# Patient Record
Sex: Female | Born: 1935
Health system: Southern US, Community
[De-identification: ages and names within clinical notes are randomized; demographics above are authoritative.]

## PROBLEM LIST (undated history)

## (undated) DIAGNOSIS — I639 Cerebral infarction, unspecified: Secondary | ICD-10-CM

## (undated) DIAGNOSIS — M549 Dorsalgia, unspecified: Secondary | ICD-10-CM

## (undated) DIAGNOSIS — M4856XA Collapsed vertebra, not elsewhere classified, lumbar region, initial encounter for fracture: Secondary | ICD-10-CM

## (undated) DIAGNOSIS — I6529 Occlusion and stenosis of unspecified carotid artery: Secondary | ICD-10-CM

## (undated) DIAGNOSIS — G459 Transient cerebral ischemic attack, unspecified: Secondary | ICD-10-CM

## (undated) DIAGNOSIS — I10 Essential (primary) hypertension: Secondary | ICD-10-CM

## (undated) DIAGNOSIS — J449 Chronic obstructive pulmonary disease, unspecified: Secondary | ICD-10-CM

## (undated) HISTORY — PX: APPENDECTOMY: SHX54

## (undated) HISTORY — DX: Cerebral infarction, unspecified: I63.9

---

## 1998-05-18 ENCOUNTER — Emergency Department (HOSPITAL_COMMUNITY): Admission: EM | Admit: 1998-05-18 | Discharge: 1998-05-18 | Payer: Self-pay | Admitting: Emergency Medicine

## 1998-05-20 ENCOUNTER — Ambulatory Visit (HOSPITAL_COMMUNITY): Admission: RE | Admit: 1998-05-20 | Discharge: 1998-05-20 | Payer: Self-pay | Admitting: Rheumatology

## 1998-05-27 ENCOUNTER — Ambulatory Visit (HOSPITAL_COMMUNITY): Admission: RE | Admit: 1998-05-27 | Discharge: 1998-05-27 | Payer: Self-pay | Admitting: Rheumatology

## 1999-12-07 ENCOUNTER — Encounter: Payer: Self-pay | Admitting: Emergency Medicine

## 1999-12-07 ENCOUNTER — Emergency Department (HOSPITAL_COMMUNITY): Admission: EM | Admit: 1999-12-07 | Discharge: 1999-12-07 | Payer: Self-pay | Admitting: Emergency Medicine

## 1999-12-11 ENCOUNTER — Encounter: Payer: Self-pay | Admitting: Rheumatology

## 1999-12-11 ENCOUNTER — Encounter: Admission: RE | Admit: 1999-12-11 | Discharge: 1999-12-11 | Payer: Self-pay | Admitting: Rheumatology

## 1999-12-16 ENCOUNTER — Encounter: Payer: Self-pay | Admitting: Rheumatology

## 1999-12-16 ENCOUNTER — Encounter: Admission: RE | Admit: 1999-12-16 | Discharge: 1999-12-16 | Payer: Self-pay | Admitting: Rheumatology

## 2000-07-24 ENCOUNTER — Encounter: Payer: Self-pay | Admitting: Emergency Medicine

## 2000-07-24 ENCOUNTER — Inpatient Hospital Stay (HOSPITAL_COMMUNITY): Admission: EM | Admit: 2000-07-24 | Discharge: 2000-07-31 | Payer: Self-pay

## 2000-07-25 ENCOUNTER — Encounter: Payer: Self-pay | Admitting: Internal Medicine

## 2000-07-26 ENCOUNTER — Encounter: Payer: Self-pay | Admitting: Internal Medicine

## 2000-07-27 ENCOUNTER — Encounter: Payer: Self-pay | Admitting: Internal Medicine

## 2000-07-28 ENCOUNTER — Encounter: Payer: Self-pay | Admitting: Internal Medicine

## 2000-07-29 ENCOUNTER — Encounter: Payer: Self-pay | Admitting: Internal Medicine

## 2002-03-17 ENCOUNTER — Encounter: Payer: Self-pay | Admitting: Internal Medicine

## 2002-03-17 ENCOUNTER — Encounter: Admission: RE | Admit: 2002-03-17 | Discharge: 2002-03-17 | Payer: Self-pay | Admitting: Internal Medicine

## 2006-08-09 ENCOUNTER — Encounter: Admission: RE | Admit: 2006-08-09 | Discharge: 2006-08-09 | Payer: Self-pay | Admitting: Internal Medicine

## 2006-10-07 ENCOUNTER — Encounter: Admission: RE | Admit: 2006-10-07 | Discharge: 2006-10-07 | Payer: Self-pay | Admitting: *Deleted

## 2007-01-13 ENCOUNTER — Ambulatory Visit: Payer: Self-pay | Admitting: *Deleted

## 2007-02-03 ENCOUNTER — Ambulatory Visit: Payer: Self-pay | Admitting: *Deleted

## 2007-08-18 ENCOUNTER — Ambulatory Visit: Payer: Self-pay | Admitting: *Deleted

## 2008-01-26 ENCOUNTER — Ambulatory Visit: Payer: Self-pay | Admitting: *Deleted

## 2008-08-16 ENCOUNTER — Ambulatory Visit: Payer: Self-pay | Admitting: *Deleted

## 2009-02-07 ENCOUNTER — Ambulatory Visit: Payer: Self-pay | Admitting: *Deleted

## 2009-11-05 ENCOUNTER — Emergency Department (HOSPITAL_COMMUNITY): Admission: EM | Admit: 2009-11-05 | Discharge: 2009-11-05 | Payer: Self-pay | Admitting: Emergency Medicine

## 2010-01-08 ENCOUNTER — Observation Stay (HOSPITAL_COMMUNITY): Admission: EM | Admit: 2010-01-08 | Discharge: 2010-01-09 | Payer: Self-pay | Admitting: Emergency Medicine

## 2011-01-11 LAB — DIFFERENTIAL
Basophils Absolute: 0 10*3/uL (ref 0.0–0.1)
Basophils Relative: 0 % (ref 0–1)
Eosinophils Absolute: 0.1 10*3/uL (ref 0.0–0.7)
Eosinophils Relative: 1 % (ref 0–5)
Monocytes Relative: 4 % (ref 3–12)
Neutrophils Relative %: 88 % — ABNORMAL HIGH (ref 43–77)

## 2011-01-11 LAB — COMPREHENSIVE METABOLIC PANEL
ALT: 15 U/L (ref 0–35)
AST: 22 U/L (ref 0–37)
Albumin: 4 g/dL (ref 3.5–5.2)
Chloride: 97 mEq/L (ref 96–112)
Creatinine, Ser: 1.16 mg/dL (ref 0.4–1.2)

## 2011-01-11 LAB — LIPASE, BLOOD: Lipase: 30 U/L (ref 11–59)

## 2011-01-11 LAB — URINALYSIS, ROUTINE W REFLEX MICROSCOPIC
Bilirubin Urine: NEGATIVE
Hgb urine dipstick: NEGATIVE
Ketones, ur: NEGATIVE mg/dL
Nitrite: NEGATIVE
Protein, ur: 30 mg/dL — AB
Urobilinogen, UA: 0.2 mg/dL (ref 0.0–1.0)

## 2011-01-11 LAB — CBC
MCHC: 33.8 g/dL (ref 30.0–36.0)
Platelets: 225 10*3/uL (ref 150–400)
RBC: 4.57 MIL/uL (ref 3.87–5.11)

## 2011-01-11 LAB — APTT: aPTT: 28 seconds (ref 24–37)

## 2011-01-11 LAB — PROTIME-INR
INR: 0.99 (ref 0.00–1.49)
Prothrombin Time: 13 seconds (ref 11.6–15.2)

## 2011-01-19 LAB — DIFFERENTIAL
Basophils Absolute: 0.1 10*3/uL (ref 0.0–0.1)
Lymphs Abs: 1.5 10*3/uL (ref 0.7–4.0)
Monocytes Absolute: 0.7 10*3/uL (ref 0.1–1.0)
Neutro Abs: 5.6 10*3/uL (ref 1.7–7.7)
Neutrophils Relative %: 69 % (ref 43–77)

## 2011-01-19 LAB — BASIC METABOLIC PANEL
Calcium: 10.2 mg/dL (ref 8.4–10.5)
Chloride: 99 mEq/L (ref 96–112)
Creatinine, Ser: 1.15 mg/dL (ref 0.4–1.2)
GFR calc non Af Amer: 45 mL/min — ABNORMAL LOW (ref >60)
Potassium: 4.5 mEq/L (ref 3.5–5.1)
Sodium: 139 mEq/L (ref 135–145)

## 2011-01-19 LAB — POCT I-STAT, CHEM 8
BUN: 36 mg/dL — ABNORMAL HIGH (ref 6–23)
Chloride: 104 mEq/L (ref 96–112)
Hemoglobin: 15.6 g/dL — ABNORMAL HIGH (ref 12.0–15.0)
Potassium: 3.3 mEq/L — ABNORMAL LOW (ref 3.5–5.1)
TCO2: 32 mmol/L (ref 0–100)

## 2011-01-19 LAB — CBC
HCT: 41 % (ref 36.0–46.0)
Hemoglobin: 13.8 g/dL (ref 12.0–15.0)
MCHC: 32.3 g/dL (ref 30.0–36.0)
MCHC: 33.7 g/dL (ref 30.0–36.0)
MCV: 92 fL (ref 78.0–100.0)
MCV: 94.5 fL (ref 78.0–100.0)

## 2011-01-19 LAB — POCT CARDIAC MARKERS
CKMB, poc: 4.8 ng/mL (ref 1.0–8.0)
Myoglobin, poc: 145 ng/mL (ref 12–200)
Troponin i, poc: <0.05 ng/mL (ref 0.00–0.09)

## 2011-01-19 LAB — POCT I-STAT 3, ART BLOOD GAS (G3+)
O2 Saturation: 100 %
Patient temperature: 98.7
TCO2: 35 mmol/L (ref 0–100)
pCO2 arterial: 53.4 mmHg — ABNORMAL HIGH (ref 35.0–45.0)

## 2011-03-10 NOTE — Procedures (Signed)
CAROTID DUPLEX EXAM   INDICATION:  Followup of known carotid artery disease.   HISTORY:  Diabetes:  No.  Cardiac:  No.  Hypertension:  Yes.  Smoking:  No.  Previous Surgery:  No.  CV History:  No.  Amaurosis Fugax No, Paresthesias No, Hemiparesis No                                       RIGHT             LEFT  Brachial systolic pressure:         176               184  Brachial Doppler waveforms:         Biphasic          Biphasic  Vertebral direction of flow:        Antegrade         Antegrade  DUPLEX VELOCITIES (cm/sec)  CCA peak systolic                   97                43  ECA peak systolic                   227               107  ICA peak systolic                   180               Occluded  ICA end diastolic                   59                Occluded  PLAQUE MORPHOLOGY:                  Heterogeneous     Calcified  PLAQUE AMOUNT:                      Moderate          Severe  PLAQUE LOCATION:                    Bif/ICA           Bif/ICA   IMPRESSION:  1. 40-59% right internal carotid artery stenosis.  2. Occluded left internal carotid artery stenosis.       ___________________________________________  P. Liliane Bade, M.D.   AC/MEDQ  D:  02/07/2009  T:  02/07/2009  Job:  [id number]

## 2011-03-10 NOTE — Procedures (Signed)
CAROTID DUPLEX EXAM   INDICATION:  Followup of known carotid artery disease.  Patient is  asymptomatic.  Known left ICA occlusion.   HISTORY:  Diabetes:  No.  Cardiac:  No.  Hypertension:  Yes.  Smoking:  No.  Previous Surgery:  No.  CV History:  Amaurosis Fugax No, Paresthesias No, Hemiparesis No                                       RIGHT             LEFT  Brachial systolic pressure:         150               138  Brachial Doppler waveforms:         WNL               WNL  Vertebral direction of flow:        Antegrade         Antegrade  (atypical)  DUPLEX VELOCITIES (cm/sec)  CCA peak systolic                   74                23  ECA peak systolic                   96                69  ICA peak systolic                   143 (P-M)         Occluded  ICA end diastolic                   53                Occluded  PLAQUE MORPHOLOGY:                  Mixed             Mixed  PLAQUE AMOUNT:                      Moderate          Severe  PLAQUE LOCATION:                    DCCA, bifurcation, ICA, ECA         CCA, bifurcation, ICA,  ECA   IMPRESSION:  1. Mixed plaque noted throughout left CCA.  2. Tortuous right ICA.  3. Right 40-59% ICA stenosis.  4. Known left ICA occlusion.  5. Bilateral antegrade flow in vertebral arteries, however, left      vertebral waveform demonstrates progressive early systolic      deceleration consistent with subclavian stenosis.  6. Study essentially unchanged from that on 08/18/2007.   ___________________________________________  P. Liliane Bade, M.D.   PB/MEDQ  D:  01/26/2008  T:  01/26/2008  Job:  [id number]

## 2011-03-10 NOTE — Procedures (Signed)
CAROTID DUPLEX EXAM   INDICATION:  Follow up known carotid artery disease.   HISTORY:  Diabetes:  No.  Cardiac:  No.  Hypertension:  Yes.  Smoking:  No.  Previous Surgery:  CV History:  Amaurosis Fugax No, Paresthesias No, Hemiparesis No.                                       RIGHT             LEFT  Brachial systolic pressure:         160               160  Brachial Doppler waveforms:         Biphasic          Biphasic  Vertebral direction of flow:        Antegrade         Antegrade  DUPLEX VELOCITIES (cm/sec)  CCA peak systolic                   69                57  ECA peak systolic                   219               90  ICA peak systolic                   183               Occluded  ICA end diastolic                   40                Occluded  PLAQUE MORPHOLOGY:                  Heterogenous      Heterogenous  PLAQUE AMOUNT:                      Moderate          Severe  PLAQUE LOCATION:                    ICA, ECA          ICA, ECA   IMPRESSION:  1. Occluded left internal carotid artery.  2. 40-59% stenosis noted in the right internal carotid artery.  3. Antegrade bilateral vertebral arteries.   ___________________________________________  P. Liliane Bade, M.D.   MG/MEDQ  D:  08/16/2008  T:  08/17/2008  Job:  [id number]

## 2011-03-10 NOTE — Procedures (Signed)
CAROTID DUPLEX EXAM   INDICATION:  Followup carotid artery disease   HISTORY:  Diabetes:  No  Cardiac:  No  Hypertension:  Yes  Smoking:  No  Previous Surgery:  No  CV History:  In March, had near syncopal episode, asymptomatic now  Amaurosis Fugax No, Paresthesias No, Hemiparesis No   Patient stated she is getting bilateral upper body washed out feeling  intermittently several times a week which last approximately 1 minute.                                       RIGHT             LEFT  Brachial systolic pressure:         170               173  Brachial Doppler waveforms:         Triphasic         Triphasic  Vertebral direction of flow:        Antegrade         Atypical  DUPLEX VELOCITIES (cm/sec)  CCA peak systolic                   55                33  ECA peak systolic                   124               74  ICA peak systolic                   126               Occluded  ICA end diastolic                   25                Occluded  PLAQUE MORPHOLOGY:                  Mixed             Mixed  PLAQUE AMOUNT:                      Moderate          Severe  PLAQUE LOCATION:                    ICA/ECA/CCA       ICA/ECA/CCA   IMPRESSION:  1. Right ICA shows evidence of 40% to 59% stenosis.  2. Left ICA appears occluded.  3. Right vertebral artery was antegrade, left vertebral artery flow is      atypical.  4. No significant changes from previous study.   ___________________________________________  P. Liliane Bade, M.D.   AS/MEDQ  D:  08/18/2007  T:  08/19/2007  Job:  [id number]

## 2011-03-13 NOTE — Discharge Summary (Signed)
[place]. [facility]  Patient:    Erin Cox, Erin Cox                           MRN: [id number] Adm. Date:  [id number] Disc. Date: [id number] Attending:  Pearla Dubonnet CC:         Demetria Pore. Coral Spikes, M.D.  Catalina Lunger, M.D.   Discharge Summary  DISCHARGE DIAGNOSES: 1. Severe abdominal pain - resolved. 2. Partial small-bowel obstruction - resolved. 3. Severe hypokalemia which likely contributed to her partial small-bowel    obstruction. 4. Hypertension. 5. History of asthma. 6. History of cerebrovascular disease. 7. Hypercholesterolemia.  PROCEDURES: 1. NG tube placement for partial small-bowel obstruction. 2. Abdominal CT July 25, 2000 - unremarkable. 3. Hepatic biliary scan - cystic duct patent, mildly delayed entry to the    gallbladder. 4. Multiple abdominal films to assess degree of small-bowel obstruction.  DISCHARGE MEDICATIONS: 1. Quibron per dose prior to hospitalization. 2. Catapres-TTS-2 patch apply to skin once weekly. 3. Patient was to discontinue Enduron, aspirin, and potassium.  HOSPITAL COURSE:  Patient was admitted on July 24, 2000 complaining of abdominal pain.  It had been of sudden onset approximately eight hours prior to admission.  She denied any diarrhea, fever, or chills.  There was no abdominal burning.  The pains would come and go.  She denied any history of constipation, melena, or bright red blood per rectum.  She had nausea and vomiting two to three times in the emergency room.  She complained of no dysuria, no history of peptic ulcer disease, no known diverticulosis.  She went to the ______ [facility], x-ray revealed obstipation, and she was brought to [facility] ER.  She was admitted.  The thought was that she might have diverticulitis from her pain secondary to obstipation.  She was severely hypokalemic and this was to be aggressively treated and she had been on diuretic therapy.  On initial  exam, her abdomen was soft, nondistended, bowel sounds were positive, there was diffuse tenderness but mostly in the right lower quadrant paraumbilically.  Her white cell count was mildly elevated at 12,400 initially.  Electrolytes were normal except for a potassium of 2.4.  She had 86% polys on the white cell differential.  Urinalysis was normal except for 50 mg/dL of ketones. LFTs were normal.  Lipase and amylase were normal.  There was a questionable etiology for the nausea and vomiting and obstipation but it is felt that it might be secondary to hypokalemia.  Diverticulitis was entertained as well as bowel ischemia and just simply severe obstipation.  Patient was admitted and started on IV Cipro and Flagyl for possible diverticulitis as well as IV Protonix to reduce stomach acid.  Potassium was replaced intravenously and Enduron was stopped because of the hypokalemia. Clonidine patch was started for hypertension.  Patient was adamant about being a "No Code Blue" on admission.  Patient though being receptive with sodium chloride with potassium intravenously continued to have a low potassium in the high 2 and low 3 range until July 30, 2000 when it was finally brought up to 3.9.  Surgical consultation was obtained because of evidence of a partial small-bowel obstruction occurring on July 27, 2000.  She agreed to NG suction on July 28, 3000 and the NG tube was maintained for approximately 48 hours.  By the time she had pulled this out herself on July 30, 2000, her potassium was up to 3.5  and she was able to eat a progressive diet and felt back to normal on July 31, 2000 and was discharged home.  It was felt that her severe obstipation and partial small-bowel obstruction were likely secondary to hypokalemia and with resolution of the hypokalemia by stopping diuretics and replacing her with intravenous and oral potassium her symptoms resolved and she was able to be discharged  home.  Patient did have some wheezing while admitted and was treated with albuterol nebulizer treatments on a p.r.n. basis.  She was to follow up at ______ Internal Medicine and Dr. Lynelle Smoke I. Tannenbaum within one week. DD:  09/30/00 TD:  09/30/00 Job: [id number]

## 2011-03-13 NOTE — H&P (Signed)
[place]. [facility]  Patient:    Erin Cox, Erin Cox                           MRN: [id number] Adm. Date:  [id number] Attending:  Pearla Dubonnet CC:         Demetria Pore. Coral Spikes, M.D.   History and Physical  CHIEF COMPLAINT:  Abdominal pain.  HISTORY OF PRESENT ILLNESS:  Erin Cox is a very pleasant 77 year old female with the following past medical history: 1. Hypertension. 2. History of TIA and small lacunar stroke. 3. Hypercholesterolemia. 4. Asthma. 5. Status post appendectomy and tubal ligation for abdominal surgical    procedures.  MEDICATIONS: 1. K-Dur 20 mEq b.i.d. 2. Quibron 1 tablet daily. 3. Proventil inhaler 2 puffs inhaled p.r.n. 4. Enduron 5 mg daily. 5. Ecotrin 1 daily. 6. Vitamin C, E, calcium, zinc, and garlic supplements.  ALLERGIES:  PENICILLIN, IODINE, and SULFA.  She presents with the sudden onset of periumbilical pain at approximately 5:30 p.m. today.  She has had no diarrhea or obvious fever or chills.  The pain is not burning.  It tends to come and go.  She denies known constipation, melena, or bright red blood per rectum.  She was seen at the [facility] today, and abdominal films were obtained. Because of continued pain, she was taken to the [facility] Emergency Room.  Her abdominal films at [facility] appeared to be rather insignificant except for obstipation. In the [facility] Emergency Room, she has had nausea and vomiting several times.  She has not complained of dysuria.  She has never had a history of peptic ulcer disease and no known diverticulosis or diverticulitis.  She continues to have rather diffuse abdominal pain and is now admitted for further workup.  She is also significantly hypokalemic.  The patient requests to be a "No Code Blue" and has expressed this in prior office visit with Dr. Lennox Pippins.  PHYSICAL EXAMINATION:  GENERAL:  She is an elderly female complaining of abdominal  pain.  She is alert and oriented x 3.  VITAL SIGNS:  Blood pressure 180/88, pulse 80 and regular, respiratory rate 12 and easy, temperature 94.9 rectally.  HEENT:  Mucous membranes are mildly dry.  NECK:  Supple.  CARDIAC:  Regular rhythm without murmur.  ABDOMEN:  Soft, nondistended.  She has no rebound.  Her bowel sounds are positive, but she has diffuse tenderness which seems to be the most in the right lower quadrant periumbilically.  It is also mild in the left lower quadrant.  She does not have right upper quadrant or left upper quadrant pain to palpation.  RECTAL:  Heme-negative brown stool.  There is minimal stool in the rectal vault.  EXTREMITIES:  Without cyanosis, clubbing, or edema.  SKIN:  Appears pale.  NEUROLOGIC:  Exam is nonfocal.  LABORATORY DATA:  Three-way of the abdomen in terms of x-rays reveals nonobstructive bowel gas and probably a noninsulated gallstone.  Chest x-ray shows no active disease.  She does have significant increased stool.  Abdominal CT scan is pending without contrast.  EKG reveals normal sinus rhythm, first degree A-V block with a moderately elongated QT interval.  There may be a slight U wave as well.  White blood cell count 12,400.  Her differential reveals 86% neutrophils. Hemoglobin 14.3, platelet 252,000.  Sodium 134, potassium 2.4, chloride 96, bicarb 29, BUN 16, creatinine 0.7, blood sugar 167.  Urinalysis is  normal except for 15 mg/dl ketones.  LFTs are normal.  Lipase and amylase are normal at 32 and 121, respectively.  ASSESSMENT:  A 77 year old female with history of hypertension, asthma, and aspirin therapy with nausea, vomiting, without diarrhea.  She has obvious obstipation of abdominal films, and she is hypokalemic.  Most likely diagnoses would be diverticulitis versus bowel ischemia versus sever obstipation causing her current symptoms.  It is possible that she has peptic ulcer disease.  No evidence of free air  on x-rays, and she does have bowel sounds which are present, and I would suspect an ileus with perforated peptic ulcer.  PLAN:  1. Follow up on noncontrasted abdominal CT looking for diverticulitis.  2. Treat with IV ciprofloxacin and Flagyl to cover for diverticulitis.  3. IV Protonix therapy - doubt perforated ulcer but possible.  4. Clear liquid diet for now.  5. Replace potassium IV and hold Enduron which is her thiazide diuretic.  6. Start Clonidine patch and use Norvasc p.r.n. if blood pressure systolics     remain above 180 on Clonidine patch.  7. Hold aspirin.  8. Blood cultures x 2.  9. Consider abdominal ultrasound.  She does have a gallstone, and if there     is no obvious diagnosis by abdominal CT, this may be helpful. 10. She is a "No Code Blue." 11. Consider enemas in the a.m. if there is no evidence of diverticulitis. DD:  07/25/00 TD:  07/25/00 Job: [id number]

## 2012-01-09 ENCOUNTER — Emergency Department (HOSPITAL_COMMUNITY): Payer: Medicare Other

## 2012-01-09 ENCOUNTER — Emergency Department (HOSPITAL_COMMUNITY)
Admission: EM | Admit: 2012-01-09 | Discharge: 2012-01-09 | Disposition: A | Discharge status: Home / Self Care | Payer: Medicare Other | Attending: Emergency Medicine | Admitting: Emergency Medicine

## 2012-01-09 ENCOUNTER — Encounter (HOSPITAL_COMMUNITY): Payer: Self-pay

## 2012-01-09 DIAGNOSIS — S42213A Unspecified displaced fracture of surgical neck of unspecified humerus, initial encounter for closed fracture: Secondary | ICD-10-CM | POA: Insufficient documentation

## 2012-01-09 DIAGNOSIS — M25519 Pain in unspecified shoulder: Secondary | ICD-10-CM | POA: Diagnosis not present

## 2012-01-09 DIAGNOSIS — W19XXXA Unspecified fall, initial encounter: Secondary | ICD-10-CM | POA: Insufficient documentation

## 2012-01-09 DIAGNOSIS — M25419 Effusion, unspecified shoulder: Secondary | ICD-10-CM | POA: Diagnosis not present

## 2012-01-09 DIAGNOSIS — I10 Essential (primary) hypertension: Secondary | ICD-10-CM | POA: Insufficient documentation

## 2012-01-09 DIAGNOSIS — J45909 Unspecified asthma, uncomplicated: Secondary | ICD-10-CM | POA: Diagnosis not present

## 2012-01-09 DIAGNOSIS — Z79899 Other long term (current) drug therapy: Secondary | ICD-10-CM | POA: Diagnosis not present

## 2012-01-09 DIAGNOSIS — S42309A Unspecified fracture of shaft of humerus, unspecified arm, initial encounter for closed fracture: Secondary | ICD-10-CM

## 2012-01-09 HISTORY — DX: Essential (primary) hypertension: I10

## 2012-01-09 MED ORDER — HYDROCODONE-ACETAMINOPHEN 5-500 MG PO TABS: 1.0000 | ORAL_TABLET | Freq: Four times a day (QID) | ORAL | Status: AC | PRN

## 2012-01-09 MED ORDER — PROMETHAZINE HCL 25 MG/ML IJ SOLN
12.5000 mg | Freq: Once | INTRAMUSCULAR | Status: AC
  Administered 2012-01-09: 12.5 mg via INTRAMUSCULAR
  Filled 2012-01-09: qty 1

## 2012-01-09 MED ORDER — ONDANSETRON 4 MG PO TBDP
ORAL_TABLET | ORAL | Status: AC
  Administered 2012-01-09: 4 mg
  Filled 2012-01-09: qty 1

## 2012-01-09 MED ORDER — MORPHINE SULFATE 2 MG/ML IJ SOLN
2.0000 mg | Freq: Once | INTRAMUSCULAR | Status: AC
  Administered 2012-01-09: 2 mg via INTRAMUSCULAR
  Filled 2012-01-09: qty 1

## 2012-01-09 NOTE — ED Notes (Signed)
Tripped over throw rug and fell. Landed on left shoulder, complains of pain and nausea.

## 2012-01-09 NOTE — ED Notes (Signed)
Pt is on blood thinner

## 2012-01-09 NOTE — Progress Notes (Signed)
Orthopedic Tech Progress Note Patient Details:  Erin Cox [facility] December 22, 1920 [id number]  Other Ortho Devices Type of Ortho Device: Other (comment) (sling immobilizer) Ortho Device Location: left arm Ortho Device Interventions: Application Viewed order from doctor's order list  [name] 01/09/2012, 2:02 PM

## 2012-01-09 NOTE — ED Provider Notes (Signed)
History     CSN: [id number]  Arrival date & time 01/09/12  1025   First MD Initiated Contact with Patient 01/09/12 1104      Chief Complaint  Patient presents with  . Fall    (Consider location/radiation/quality/duration/timing/severity/associated sxs/prior treatment) Patient is a 78 y.o. female presenting with fall. The history is provided by the patient.  Fall The accident occurred less than 1 hour ago. The fall occurred while walking. She fell from a height of 1 to 2 ft. She landed on a hard floor. The point of impact was the left shoulder. The pain is present in the left shoulder. The pain is severe. She was ambulatory at the scene. Associated symptoms comments: No other complaints. The symptoms are aggravated by rotation and activity. She has tried ice for the symptoms. The treatment provided no relief.    Past Medical History  Diagnosis Date  . Asthma   . Hypertension     Past Surgical History  Procedure Date  . Appendectomy     History reviewed. No pertinent family history.  History  Substance Use Topics  . Smoking status: Never Smoker   . Smokeless tobacco: Not on file  . Alcohol Use: No    OB History    Grav Para Term Preterm Abortions TAB SAB Ect Mult Living                  Review of Systems  All other systems reviewed and are negative.    Allergies  Penicillins  Home Medications   Current Outpatient Rx  Name Route Sig Dispense Refill  . CARBAMAZEPINE 100 MG PO CHEW Oral Chew 100 mg by mouth 3 (three) times daily.    Marland Kitchen CLONAZEPAM 0.5 MG PO TABS Oral Take 0.5 mg by mouth at bedtime as needed. For anxiety    . CLONIDINE HCL 0.2 MG PO TABS Oral Take 0.2 mg by mouth at bedtime.    . CLOPIDOGREL BISULFATE 75 MG PO TABS Oral Take 75 mg by mouth daily.    Marland Kitchen FELODIPINE ER 10 MG PO TB24 Oral Take 10 mg by mouth daily.    Marland Kitchen LEVALBUTEROL TARTRATE 45 MCG/ACT IN AERO Inhalation Inhale 2 puffs into the lungs every 6 (six) hours as needed. For shortness of  breath    . TELMISARTAN-HCTZ 80-25 MG PO TABS Oral Take 0.5 tablets by mouth daily.    . THEOPHYLLINE ER 300 MG PO CP24 Oral Take 300 mg by mouth daily.      BP 188/73  Pulse 78  Temp(Src) 97.5 F (36.4 C) (Oral)  Resp 18  SpO2 98%  Physical Exam  Nursing note and vitals reviewed. Constitutional: She is oriented to person, place, and time. She appears well-developed and well-nourished. No distress.  HENT:  Head: Normocephalic and atraumatic.  Neck: Normal range of motion. Neck supple.  Cardiovascular: Normal rate and regular rhythm.   No murmur heard. Pulmonary/Chest: Effort normal and breath sounds normal. No respiratory distress. She has no wheezes.  Abdominal: Soft. Bowel sounds are normal. She exhibits no distension. There is no tenderness.  Musculoskeletal:       There is swelling, ttp over the left shoulder.  The distal extremity is neurovasc intact.    Neurological: She is alert and oriented to person, place, and time.  Skin: Skin is warm and dry. She is not diaphoretic.    ED Course  Procedures (including critical care time)  Labs Reviewed - No data to display No results found.  No diagnosis found.    MDM  Spoke with Dr. Rayburn Ma, wants patient in shoulder immoblizer, follow up this week.          Geoffery Lyons, MD 01/09/12 1311

## 2012-01-09 NOTE — Discharge Instructions (Signed)
Upper Extremity Fracture       Broken bones take weeks to heal and require protection and proper follow-up. The broken ends must be lined up correctly and kept perfectly still for proper healing. Do not remove the splint, immobilizer, or cast that has been applied to treat your injury until instructed to do so by your caregiver. This is the most important part of your treatment. Other measures to treat fractures include:   Keeping the injured limb at rest and elevated as recommended by your caregiver. This will help reduce pain and swelling. Use pillows to rest and elevate your arm on at night.   Applying ice packs to your fracture site frequently for the next 2 to 3 days.   Pain medicine is often prescribed in the first days after a fracture. Only take over-the-counter or prescription medicines for pain, discomfort, or fever as directed by your caregiver.  Proper follow-up care is very important, so call your caregiver for an appointment as soon as possible. Follow-up X-rays are generally recommended to monitor healing.   SEEK IMMEDIATE MEDICAL CARE IF:   You notice increasing pain or pressure in the injured arm or hand, or if it your extremity becomes cold, numb, or pale.   MAKE SURE YOU:   Understand these instructions.   Will watch your condition.   Will get help right away if you are not doing well or get worse.  Document Released: 11/19/2004 Document Revised: 10/01/2011 Document Reviewed: 11/14/2008   [employer] Patient Information 2012 [employer].

## 2012-01-11 DIAGNOSIS — S42209A Unspecified fracture of upper end of unspecified humerus, initial encounter for closed fracture: Secondary | ICD-10-CM | POA: Diagnosis not present

## 2012-01-15 DIAGNOSIS — S42209A Unspecified fracture of upper end of unspecified humerus, initial encounter for closed fracture: Secondary | ICD-10-CM | POA: Diagnosis not present

## 2012-01-18 DIAGNOSIS — R55 Syncope and collapse: Secondary | ICD-10-CM | POA: Diagnosis not present

## 2012-01-18 DIAGNOSIS — F411 Generalized anxiety disorder: Secondary | ICD-10-CM | POA: Diagnosis not present

## 2012-02-01 DIAGNOSIS — R55 Syncope and collapse: Secondary | ICD-10-CM | POA: Diagnosis not present

## 2012-02-01 DIAGNOSIS — F411 Generalized anxiety disorder: Secondary | ICD-10-CM | POA: Diagnosis not present

## 2012-02-01 DIAGNOSIS — I10 Essential (primary) hypertension: Secondary | ICD-10-CM | POA: Diagnosis not present

## 2012-02-01 DIAGNOSIS — J45909 Unspecified asthma, uncomplicated: Secondary | ICD-10-CM | POA: Diagnosis not present

## 2012-02-14 ENCOUNTER — Emergency Department (HOSPITAL_COMMUNITY): Payer: Medicare Other

## 2012-02-14 ENCOUNTER — Encounter (HOSPITAL_COMMUNITY): Payer: Self-pay | Admitting: Emergency Medicine

## 2012-02-14 ENCOUNTER — Emergency Department (HOSPITAL_COMMUNITY)
Admission: EM | Admit: 2012-02-14 | Discharge: 2012-02-14 | Disposition: A | Discharge status: Home / Self Care | Payer: Medicare Other | Attending: Emergency Medicine | Admitting: Emergency Medicine

## 2012-02-14 DIAGNOSIS — R42 Dizziness and giddiness: Secondary | ICD-10-CM | POA: Diagnosis not present

## 2012-02-14 DIAGNOSIS — R000 Tachycardia, unspecified: Secondary | ICD-10-CM | POA: Insufficient documentation

## 2012-02-14 DIAGNOSIS — R112 Nausea with vomiting, unspecified: Secondary | ICD-10-CM | POA: Diagnosis not present

## 2012-02-14 DIAGNOSIS — J449 Chronic obstructive pulmonary disease, unspecified: Secondary | ICD-10-CM | POA: Insufficient documentation

## 2012-02-14 DIAGNOSIS — I10 Essential (primary) hypertension: Secondary | ICD-10-CM | POA: Diagnosis not present

## 2012-02-14 DIAGNOSIS — R109 Unspecified abdominal pain: Secondary | ICD-10-CM | POA: Insufficient documentation

## 2012-02-14 DIAGNOSIS — I498 Other specified cardiac arrhythmias: Secondary | ICD-10-CM | POA: Diagnosis not present

## 2012-02-14 DIAGNOSIS — Z8673 Personal history of transient ischemic attack (TIA), and cerebral infarction without residual deficits: Secondary | ICD-10-CM | POA: Insufficient documentation

## 2012-02-14 DIAGNOSIS — G459 Transient cerebral ischemic attack, unspecified: Secondary | ICD-10-CM | POA: Insufficient documentation

## 2012-02-14 DIAGNOSIS — M25519 Pain in unspecified shoulder: Secondary | ICD-10-CM | POA: Diagnosis not present

## 2012-02-14 DIAGNOSIS — J4489 Other specified chronic obstructive pulmonary disease: Secondary | ICD-10-CM | POA: Insufficient documentation

## 2012-02-14 DIAGNOSIS — K5900 Constipation, unspecified: Secondary | ICD-10-CM | POA: Insufficient documentation

## 2012-02-14 DIAGNOSIS — I6529 Occlusion and stenosis of unspecified carotid artery: Secondary | ICD-10-CM | POA: Insufficient documentation

## 2012-02-14 DIAGNOSIS — Z79899 Other long term (current) drug therapy: Secondary | ICD-10-CM | POA: Diagnosis not present

## 2012-02-14 DIAGNOSIS — I700 Atherosclerosis of aorta: Secondary | ICD-10-CM | POA: Diagnosis not present

## 2012-02-14 DIAGNOSIS — R1110 Vomiting, unspecified: Secondary | ICD-10-CM | POA: Diagnosis not present

## 2012-02-14 DIAGNOSIS — R1084 Generalized abdominal pain: Secondary | ICD-10-CM | POA: Diagnosis not present

## 2012-02-14 HISTORY — DX: Chronic obstructive pulmonary disease, unspecified: J44.9

## 2012-02-14 HISTORY — DX: Transient cerebral ischemic attack, unspecified: G45.9

## 2012-02-14 HISTORY — DX: Occlusion and stenosis of unspecified carotid artery: I65.29

## 2012-02-14 LAB — DIFFERENTIAL
Basophils Absolute: 0.1 K/uL (ref 0.0–0.1)
Basophils Relative: 1 % (ref 0–1)
Eosinophils Absolute: 0.1 K/uL (ref 0.0–0.7)
Eosinophils Relative: 1 % (ref 0–5)
Lymphocytes Relative: 7 % — ABNORMAL LOW (ref 12–46)
Lymphs Abs: 0.6 10*3/uL — ABNORMAL LOW (ref 0.7–4.0)
Monocytes Absolute: 0.3 K/uL (ref 0.1–1.0)
Monocytes Relative: 4 % (ref 3–12)
Neutro Abs: 7.1 10*3/uL (ref 1.7–7.7)
Neutrophils Relative %: 87 % — ABNORMAL HIGH (ref 43–77)

## 2012-02-14 LAB — COMPREHENSIVE METABOLIC PANEL
Albumin: 4 g/dL (ref 3.5–5.2)
Alkaline Phosphatase: 94 U/L (ref 39–117)
BUN: 24 mg/dL — ABNORMAL HIGH (ref 6–23)
CO2: 28 mEq/L (ref 19–32)
Chloride: 98 mEq/L (ref 96–112)
GFR calc non Af Amer: 58 mL/min — ABNORMAL LOW (ref >90)
Glucose, Bld: 122 mg/dL — ABNORMAL HIGH (ref 70–99)
Potassium: 3.3 mEq/L — ABNORMAL LOW (ref 3.5–5.1)
Total Bilirubin: 0.4 mg/dL (ref 0.3–1.2)

## 2012-02-14 LAB — COMPREHENSIVE METABOLIC PANEL WITH GFR
ALT: 8 U/L (ref 0–35)
AST: 16 U/L (ref 0–37)
Calcium: 10 mg/dL (ref 8.4–10.5)
Creatinine, Ser: 0.87 mg/dL (ref 0.50–1.10)
GFR calc Af Amer: 67 mL/min — ABNORMAL LOW (ref >90)
Sodium: 138 meq/L (ref 135–145)
Total Protein: 7.4 g/dL (ref 6.0–8.3)

## 2012-02-14 LAB — URINALYSIS, ROUTINE W REFLEX MICROSCOPIC
Bilirubin Urine: NEGATIVE
Glucose, UA: NEGATIVE mg/dL
Ketones, ur: NEGATIVE mg/dL
Leukocytes, UA: NEGATIVE
Nitrite: NEGATIVE
Protein, ur: >300 mg/dL — AB
Specific Gravity, Urine: 1.014 (ref 1.005–1.030)
Urobilinogen, UA: 0.2 mg/dL (ref 0.0–1.0)
pH: 7.5 (ref 5.0–8.0)

## 2012-02-14 LAB — CBC
HCT: 41.5 % (ref 36.0–46.0)
Hemoglobin: 14 g/dL (ref 12.0–15.0)
MCH: 31.2 pg (ref 26.0–34.0)
MCHC: 33.7 g/dL (ref 30.0–36.0)
MCV: 92.4 fL (ref 78.0–100.0)
Platelets: 316 K/uL (ref 150–400)
RBC: 4.49 MIL/uL (ref 3.87–5.11)
RDW: 14.1 % (ref 11.5–15.5)
WBC: 8.1 10*3/uL (ref 4.0–10.5)

## 2012-02-14 LAB — URINE MICROSCOPIC-ADD ON

## 2012-02-14 LAB — LIPASE, BLOOD: Lipase: 27 U/L (ref 11–59)

## 2012-02-14 MED ORDER — FENTANYL CITRATE 0.05 MG/ML IJ SOLN
25.0000 ug | Freq: Once | INTRAMUSCULAR | Status: AC
  Administered 2012-02-14: 25 ug via INTRAVENOUS
  Filled 2012-02-14: qty 2

## 2012-02-14 MED ORDER — SODIUM CHLORIDE 0.9 % IV BOLUS (SEPSIS)
500.0000 mL | Freq: Once | INTRAVENOUS | Status: AC
  Administered 2012-02-14: 500 mL via INTRAVENOUS

## 2012-02-14 MED ORDER — METOCLOPRAMIDE HCL 5 MG/ML IJ SOLN
10.0000 mg | Freq: Once | INTRAMUSCULAR | Status: AC
  Administered 2012-02-14: 10 mg via INTRAVENOUS

## 2012-02-14 MED ORDER — CLONIDINE HCL 0.1 MG PO TABS
0.2000 mg | ORAL_TABLET | Freq: Once | ORAL | Status: AC
  Administered 2012-02-14: 0.2 mg via ORAL
  Filled 2012-02-14: qty 2

## 2012-02-14 MED ORDER — IOHEXOL 300 MG/ML  SOLN
80.0000 mL | Freq: Once | INTRAMUSCULAR | Status: AC | PRN
  Administered 2012-02-14: 80 mL via INTRAVENOUS

## 2012-02-14 MED ORDER — ONDANSETRON HCL 4 MG/2ML IJ SOLN
4.0000 mg | Freq: Once | INTRAMUSCULAR | Status: AC
  Administered 2012-02-14: 4 mg via INTRAVENOUS
  Filled 2012-02-14: qty 2

## 2012-02-14 MED ORDER — ONDANSETRON 4 MG PO TBDP: ORAL_TABLET | ORAL | Status: AC

## 2012-02-14 MED ORDER — FENTANYL CITRATE 0.05 MG/ML IJ SOLN: 50.0000 ug | Freq: Once | INTRAMUSCULAR | Status: DC

## 2012-02-14 MED ORDER — METOCLOPRAMIDE HCL 5 MG/ML IJ SOLN
INTRAMUSCULAR | Status: AC
  Filled 2012-02-14: qty 2

## 2012-02-14 MED ORDER — ACETAMINOPHEN 325 MG PO TABS
650.0000 mg | ORAL_TABLET | Freq: Once | ORAL | Status: AC
  Administered 2012-02-14: 650 mg via ORAL
  Filled 2012-02-14: qty 2

## 2012-02-14 MED ORDER — SODIUM CHLORIDE 0.9 % IV SOLN
Freq: Once | INTRAVENOUS | Status: AC
  Administered 2012-02-14: 100 mL/h via INTRAVENOUS

## 2012-02-14 NOTE — ED Notes (Signed)
Patient complaining of nausea and vomiting since 2000 yesterday evening; patient took one nausea pill, but ended up throwing it up.  Patient reports throwing up 20 times within the last 24 hours; last emesis around 0420.  Reports abdominal pain.

## 2012-02-14 NOTE — ED Provider Notes (Signed)
Care assumed from Dr. Oletta Lamas at shift change.  I agree with his note, assessment, and plan.  The patient presented with nausea and vomiting and was quite uncomfortable.  Workup was performed by Dr. Oletta Lamas and did not show any acute pathology.  She was given fluids and medications and she was feeling much better.  A CT scan was performed which was unremarkable for acute process.  She is feeling better and is adamant that she go home.  She was offered admission, however she does not want this.  She wants to go home and will return if things change for the worse.    Geoffery Lyons, MD 02/14/12 1123

## 2012-02-14 NOTE — Discharge Instructions (Signed)
Nausea and Vomiting  Nausea is a sick feeling that often comes before throwing up (vomiting). Vomiting is a reflex where stomach contents come out of your mouth. Vomiting can cause severe loss of body fluids (dehydration). Children and elderly adults can become dehydrated quickly, especially if they also have diarrhea. Nausea and vomiting are symptoms of a condition or disease. It is important to find the cause of your symptoms.  CAUSES    Direct irritation of the stomach lining. This irritation can result from increased acid production (gastroesophageal reflux disease), infection, food poisoning, taking certain medicines (such as nonsteroidal anti-inflammatory drugs), alcohol use, or tobacco use.   Signals from the brain.These signals could be caused by a headache, heat exposure, an inner ear disturbance, increased pressure in the brain from injury, infection, a tumor, or a concussion, pain, emotional stimulus, or metabolic problems.   An obstruction in the gastrointestinal tract (bowel obstruction).   Illnesses such as diabetes, hepatitis, gallbladder problems, appendicitis, kidney problems, cancer, sepsis, atypical symptoms of a heart attack, or eating disorders.   Medical treatments such as chemotherapy and radiation.   Receiving medicine that makes you sleep (general anesthetic) during surgery.  DIAGNOSIS  Your caregiver may ask for tests to be done if the problems do not improve after a few days. Tests may also be done if symptoms are severe or if the reason for the nausea and vomiting is not clear. Tests may include:   Urine tests.   Blood tests.   Stool tests.   Cultures (to look for evidence of infection).   X-rays or other imaging studies.  Test results can help your caregiver make decisions about treatment or the need for additional tests.  TREATMENT  You need to stay well hydrated. Drink frequently but in small amounts.You may wish to drink water, sports drinks, clear broth, or eat frozen  ice pops or gelatin dessert to help stay hydrated.When you eat, eating slowly may help prevent nausea.There are also some antinausea medicines that may help prevent nausea.  HOME CARE INSTRUCTIONS    Take all medicine as directed by your caregiver.   If you do not have an appetite, do not force yourself to eat. However, you must continue to drink fluids.   If you have an appetite, eat a normal diet unless your caregiver tells you differently.   Eat a variety of complex carbohydrates (rice, wheat, potatoes, bread), lean meats, yogurt, fruits, and vegetables.   Avoid high-fat foods because they are more difficult to digest.   Drink enough water and fluids to keep your urine clear or pale yellow.   If you are dehydrated, ask your caregiver for specific rehydration instructions. Signs of dehydration may include:   Severe thirst.   Dry lips and mouth.   Dizziness.   Dark urine.   Decreasing urine frequency and amount.   Confusion.   Rapid breathing or pulse.  SEEK IMMEDIATE MEDICAL CARE IF:    You have blood or brown flecks (like coffee grounds) in your vomit.   You have black or bloody stools.   You have a severe headache or stiff neck.   You are confused.   You have severe abdominal pain.   You have chest pain or trouble breathing.   You do not urinate at least once every 8 hours.   You develop cold or clammy skin.   You continue to vomit for longer than 24 to 48 hours.   You have a fever.  MAKE SURE YOU:      Understand these instructions.   Will watch your condition.   Will get help right away if you are not doing well or get worse.  Document Released: 10/12/2005 Document Revised: 10/01/2011 Document Reviewed: 03/11/2011  [employer] Patient Information 2012 [employer].

## 2012-02-14 NOTE — ED Provider Notes (Signed)
History     CSN: [id number]  Arrival date & time 02/14/12  4696   First MD Initiated Contact with Patient 02/14/12 [phone]      Chief Complaint  Patient presents with  . Emesis    (Consider location/radiation/quality/duration/timing/severity/associated sxs/prior treatment) HPI Comments: Level 5 caveat due to severe illness, last night around 2100 developed several episodes of persistent N/V.  She reports some diffuse abd pain just as nausea was beginning, but no CP, SOB.  She has been taking intermittent Norco due to shoulder fracture from a fall on March 15th to left side.  She denies fevers, has chills, feels lightheaded, but no fainting.  No diarrhea, in fact has been constipated due to narcotic use recently. Shoulder is improving, only had to take 2 Norco yesterday.  Has not used any abx for any reason recently per family.  No back pain, HA, flank pain, dysuria.    Patient is a 78 y.o. female presenting with vomiting. The history is provided by the patient and a relative.  Emesis  Associated symptoms include abdominal pain and arthralgias. Pertinent negatives include no chills, no diarrhea and no fever.    Past Medical History  Diagnosis Date  . Asthma   . Hypertension   . COPD (chronic obstructive pulmonary disease)   . TIA (transient ischemic attack)   . Carotid artery obstruction     Past Surgical History  Procedure Date  . Appendectomy     History reviewed. No pertinent family history.  History  Substance Use Topics  . Smoking status: Never Smoker   . Smokeless tobacco: Not on file  . Alcohol Use: No    OB History    Grav Para Term Preterm Abortions TAB SAB Ect Mult Living                  Review of Systems  Constitutional: Negative for fever and chills.  Cardiovascular: Negative for chest pain.  Gastrointestinal: Positive for nausea, vomiting, abdominal pain and constipation. Negative for diarrhea and blood in stool.  Musculoskeletal: Positive for  arthralgias. Negative for back pain.  Neurological: Positive for light-headedness.  All other systems reviewed and are negative.    Allergies  Penicillins  Home Medications   Current Outpatient Rx  Name Route Sig Dispense Refill  . ALPRAZOLAM 0.25 MG PO TABS Oral Take 0.125 mg by mouth 2 (two) times daily as needed. For anxiety    . CLONIDINE HCL 0.2 MG PO TABS Oral Take 0.2 mg by mouth at bedtime.    . CLOPIDOGREL BISULFATE 75 MG PO TABS Oral Take 75 mg by mouth daily.    . TELMISARTAN-HCTZ 80-25 MG PO TABS Oral Take 0.5 tablets by mouth daily.    . THEOPHYLLINE ER 300 MG PO CP24 Oral Take 150 mg by mouth daily.     Marland Kitchen CARBAMAZEPINE 100 MG PO CHEW Oral Chew 100 mg by mouth 3 (three) times daily.    Marland Kitchen CLONAZEPAM 0.5 MG PO TABS Oral Take 0.5 mg by mouth at bedtime as needed. For anxiety    . LEVALBUTEROL TARTRATE 45 MCG/ACT IN AERO Inhalation Inhale 2 puffs into the lungs every 6 (six) hours as needed. For shortness of breath      BP 216/105  Pulse 102  Temp(Src) 97.8 F (36.6 C) (Oral)  Resp 16  SpO2 97%  Physical Exam  Nursing note and vitals reviewed. Constitutional: She appears well-developed and well-nourished. No distress.  HENT:  Head: Normocephalic and atraumatic.  Eyes: Pupils  are equal, round, and reactive to light. No scleral icterus.  Neck: Normal range of motion.  Cardiovascular: Regular rhythm and normal heart sounds.  Tachycardia present.   Pulmonary/Chest: Effort normal. No respiratory distress. She has no wheezes.  Abdominal: Soft. There is no rebound and no guarding.  Skin: Skin is warm. No rash noted. No erythema.    ED Course  Procedures (including critical care time)   Labs Reviewed  CBC  DIFFERENTIAL  COMPREHENSIVE METABOLIC PANEL  LIPASE, BLOOD  URINALYSIS, ROUTINE W REFLEX MICROSCOPIC   Dg Abd Acute W/chest  02/14/2012  *RADIOLOGY REPORT*  Clinical Data: Abdominal pain and vomiting.  ACUTE ABDOMEN SERIES (ABDOMEN 2 VIEW & CHEST 1 VIEW)   Comparison: Chest 01/08/2010  Findings: Normal heart size and pulmonary vascularity. Emphysematous changes and scattered fibrosis in the lungs. Calcification and torsion of the aorta.  Suggestion of fracture of the left proximal humerus. No focal airspace consolidation.  No blunting of costophrenic angles.  No pneumothorax.  Gas and stool in the colon.  No small or large bowel distension. No free intra-abdominal air.  No abnormal air fluid levels. Postsurgical changes in the lower mid abdomen.  Stool in the rectosigmoid colon.  Vascular calcifications.  Degenerative changes in the spine.  IMPRESSION: Emphysematous changes in the lungs.  No evidence of active pulmonary disease.  Nonobstructive bowel gas pattern.  Original Report Authenticated By: Marlon Pel, M.D.     1. Nausea and vomiting   2. Hypertension     RA sat is 96% and normal.    ECG at time 05:44, sinus tachycardia at rate of 100, LAD, biatrial abn's, LVH, poor R wave progression.  No ST or T wave abn's.  No sig change from ECG at 01/08/2010   7:06 AM Pt has not been able to take BP meds.  Has a HA as well, no focal deficits on my exam.  Pt is on several BP meds at home, will give more fentanyl, tylenol and 0.2 mg of clonidine here.    7:17 AM Pt is signed out to Dr. Judd Lien.  MDM  IVF's and IV antiemetics.  Pt had eaten dinner about 2 hours prior to symptoms onset.  Pt has had a abd surgery in 1940's but unsure of what it was.  Abd is soft currently so I do not favor surgical problem or obstruction, but given age, CT is probably warranted.  No CP.          Gavin Pound. Oletta Lamas, MD 02/14/12 [phone]

## 2012-02-15 DIAGNOSIS — G500 Trigeminal neuralgia: Secondary | ICD-10-CM | POA: Diagnosis not present

## 2012-02-15 DIAGNOSIS — R634 Abnormal weight loss: Secondary | ICD-10-CM | POA: Diagnosis not present

## 2012-02-15 DIAGNOSIS — E538 Deficiency of other specified B group vitamins: Secondary | ICD-10-CM | POA: Diagnosis not present

## 2012-02-15 DIAGNOSIS — I10 Essential (primary) hypertension: Secondary | ICD-10-CM | POA: Diagnosis not present

## 2012-02-15 DIAGNOSIS — F411 Generalized anxiety disorder: Secondary | ICD-10-CM | POA: Diagnosis not present

## 2012-02-15 DIAGNOSIS — R112 Nausea with vomiting, unspecified: Secondary | ICD-10-CM | POA: Diagnosis not present

## 2012-02-18 DIAGNOSIS — C44320 Squamous cell carcinoma of skin of unspecified parts of face: Secondary | ICD-10-CM | POA: Diagnosis not present

## 2012-02-18 DIAGNOSIS — D0439 Carcinoma in situ of skin of other parts of face: Secondary | ICD-10-CM | POA: Diagnosis not present

## 2012-02-18 DIAGNOSIS — L570 Actinic keratosis: Secondary | ICD-10-CM | POA: Diagnosis not present

## 2012-02-18 DIAGNOSIS — Z85828 Personal history of other malignant neoplasm of skin: Secondary | ICD-10-CM | POA: Diagnosis not present

## 2012-02-23 DIAGNOSIS — S42209A Unspecified fracture of upper end of unspecified humerus, initial encounter for closed fracture: Secondary | ICD-10-CM | POA: Diagnosis not present

## 2012-02-23 DIAGNOSIS — L259 Unspecified contact dermatitis, unspecified cause: Secondary | ICD-10-CM | POA: Diagnosis not present

## 2012-02-29 DIAGNOSIS — G500 Trigeminal neuralgia: Secondary | ICD-10-CM | POA: Diagnosis not present

## 2012-02-29 DIAGNOSIS — F411 Generalized anxiety disorder: Secondary | ICD-10-CM | POA: Diagnosis not present

## 2012-02-29 DIAGNOSIS — I10 Essential (primary) hypertension: Secondary | ICD-10-CM | POA: Diagnosis not present

## 2012-02-29 DIAGNOSIS — R634 Abnormal weight loss: Secondary | ICD-10-CM | POA: Diagnosis not present

## 2012-02-29 DIAGNOSIS — E538 Deficiency of other specified B group vitamins: Secondary | ICD-10-CM | POA: Diagnosis not present

## 2012-03-17 DIAGNOSIS — L570 Actinic keratosis: Secondary | ICD-10-CM | POA: Diagnosis not present

## 2012-03-17 DIAGNOSIS — Z85828 Personal history of other malignant neoplasm of skin: Secondary | ICD-10-CM | POA: Diagnosis not present

## 2012-04-12 DIAGNOSIS — F411 Generalized anxiety disorder: Secondary | ICD-10-CM | POA: Diagnosis not present

## 2012-04-12 DIAGNOSIS — E538 Deficiency of other specified B group vitamins: Secondary | ICD-10-CM | POA: Diagnosis not present

## 2012-04-12 DIAGNOSIS — R634 Abnormal weight loss: Secondary | ICD-10-CM | POA: Diagnosis not present

## 2012-04-12 DIAGNOSIS — J449 Chronic obstructive pulmonary disease, unspecified: Secondary | ICD-10-CM | POA: Diagnosis not present

## 2012-04-12 DIAGNOSIS — G500 Trigeminal neuralgia: Secondary | ICD-10-CM | POA: Diagnosis not present

## 2012-04-12 DIAGNOSIS — I10 Essential (primary) hypertension: Secondary | ICD-10-CM | POA: Diagnosis not present

## 2012-04-14 DIAGNOSIS — L570 Actinic keratosis: Secondary | ICD-10-CM | POA: Diagnosis not present

## 2012-04-14 DIAGNOSIS — Z85828 Personal history of other malignant neoplasm of skin: Secondary | ICD-10-CM | POA: Diagnosis not present

## 2012-05-24 ENCOUNTER — Encounter: Payer: Self-pay | Admitting: Vascular Surgery

## 2012-06-09 DIAGNOSIS — Z961 Presence of intraocular lens: Secondary | ICD-10-CM | POA: Diagnosis not present

## 2012-06-16 DIAGNOSIS — I672 Cerebral atherosclerosis: Secondary | ICD-10-CM | POA: Diagnosis not present

## 2012-06-16 DIAGNOSIS — F411 Generalized anxiety disorder: Secondary | ICD-10-CM | POA: Diagnosis not present

## 2012-06-16 DIAGNOSIS — R634 Abnormal weight loss: Secondary | ICD-10-CM | POA: Diagnosis not present

## 2012-06-16 DIAGNOSIS — J449 Chronic obstructive pulmonary disease, unspecified: Secondary | ICD-10-CM | POA: Diagnosis not present

## 2012-06-16 DIAGNOSIS — I10 Essential (primary) hypertension: Secondary | ICD-10-CM | POA: Diagnosis not present

## 2012-06-16 DIAGNOSIS — E538 Deficiency of other specified B group vitamins: Secondary | ICD-10-CM | POA: Diagnosis not present

## 2012-06-16 DIAGNOSIS — G500 Trigeminal neuralgia: Secondary | ICD-10-CM | POA: Diagnosis not present

## 2012-08-02 DIAGNOSIS — Z23 Encounter for immunization: Secondary | ICD-10-CM | POA: Diagnosis not present

## 2012-10-06 DIAGNOSIS — G500 Trigeminal neuralgia: Secondary | ICD-10-CM | POA: Diagnosis not present

## 2012-10-06 DIAGNOSIS — F411 Generalized anxiety disorder: Secondary | ICD-10-CM | POA: Diagnosis not present

## 2012-10-06 DIAGNOSIS — I672 Cerebral atherosclerosis: Secondary | ICD-10-CM | POA: Diagnosis not present

## 2012-10-06 DIAGNOSIS — R634 Abnormal weight loss: Secondary | ICD-10-CM | POA: Diagnosis not present

## 2012-10-06 DIAGNOSIS — I10 Essential (primary) hypertension: Secondary | ICD-10-CM | POA: Diagnosis not present

## 2012-10-06 DIAGNOSIS — J449 Chronic obstructive pulmonary disease, unspecified: Secondary | ICD-10-CM | POA: Diagnosis not present

## 2012-10-06 DIAGNOSIS — E538 Deficiency of other specified B group vitamins: Secondary | ICD-10-CM | POA: Diagnosis not present

## 2012-10-11 DIAGNOSIS — L570 Actinic keratosis: Secondary | ICD-10-CM | POA: Diagnosis not present

## 2012-10-11 DIAGNOSIS — Z85828 Personal history of other malignant neoplasm of skin: Secondary | ICD-10-CM | POA: Diagnosis not present

## 2012-11-29 DIAGNOSIS — R197 Diarrhea, unspecified: Secondary | ICD-10-CM | POA: Diagnosis not present

## 2012-11-29 DIAGNOSIS — H9209 Otalgia, unspecified ear: Secondary | ICD-10-CM | POA: Diagnosis not present

## 2013-01-17 DIAGNOSIS — I10 Essential (primary) hypertension: Secondary | ICD-10-CM | POA: Diagnosis not present

## 2013-01-17 DIAGNOSIS — R42 Dizziness and giddiness: Secondary | ICD-10-CM | POA: Diagnosis not present

## 2013-01-17 DIAGNOSIS — Z79899 Other long term (current) drug therapy: Secondary | ICD-10-CM | POA: Diagnosis not present

## 2013-01-17 DIAGNOSIS — I672 Cerebral atherosclerosis: Secondary | ICD-10-CM | POA: Diagnosis not present

## 2013-01-17 DIAGNOSIS — G500 Trigeminal neuralgia: Secondary | ICD-10-CM | POA: Diagnosis not present

## 2013-01-30 DIAGNOSIS — R609 Edema, unspecified: Secondary | ICD-10-CM | POA: Diagnosis not present

## 2013-01-30 DIAGNOSIS — R011 Cardiac murmur, unspecified: Secondary | ICD-10-CM | POA: Diagnosis not present

## 2013-01-30 DIAGNOSIS — I10 Essential (primary) hypertension: Secondary | ICD-10-CM | POA: Diagnosis not present

## 2013-01-30 DIAGNOSIS — E871 Hypo-osmolality and hyponatremia: Secondary | ICD-10-CM | POA: Diagnosis not present

## 2013-01-30 DIAGNOSIS — I672 Cerebral atherosclerosis: Secondary | ICD-10-CM | POA: Diagnosis not present

## 2013-02-01 ENCOUNTER — Other Ambulatory Visit: Payer: Self-pay | Admitting: Internal Medicine

## 2013-02-01 ENCOUNTER — Ambulatory Visit
Admission: RE | Admit: 2013-02-01 | Discharge: 2013-02-01 | Disposition: A | Discharge status: Home / Self Care | Payer: PRIVATE HEALTH INSURANCE | Source: Ambulatory Visit | Attending: Internal Medicine | Admitting: Internal Medicine

## 2013-02-01 DIAGNOSIS — R011 Cardiac murmur, unspecified: Secondary | ICD-10-CM | POA: Diagnosis not present

## 2013-02-01 DIAGNOSIS — I749 Embolism and thrombosis of unspecified artery: Secondary | ICD-10-CM

## 2013-02-01 DIAGNOSIS — M79609 Pain in unspecified limb: Secondary | ICD-10-CM | POA: Diagnosis not present

## 2013-02-01 DIAGNOSIS — M7989 Other specified soft tissue disorders: Secondary | ICD-10-CM | POA: Diagnosis not present

## 2013-02-02 ENCOUNTER — Other Ambulatory Visit: Payer: PRIVATE HEALTH INSURANCE

## 2013-02-02 ENCOUNTER — Other Ambulatory Visit: Payer: Self-pay | Admitting: Internal Medicine

## 2013-02-02 ENCOUNTER — Ambulatory Visit
Admission: RE | Admit: 2013-02-02 | Discharge: 2013-02-02 | Disposition: A | Discharge status: Home / Self Care | Payer: Medicare Other | Source: Ambulatory Visit | Attending: Internal Medicine | Admitting: Internal Medicine

## 2013-02-02 DIAGNOSIS — R600 Localized edema: Secondary | ICD-10-CM

## 2013-02-02 DIAGNOSIS — J984 Other disorders of lung: Secondary | ICD-10-CM | POA: Diagnosis not present

## 2013-02-02 MED ORDER — IOHEXOL 350 MG/ML SOLN
100.0000 mL | Freq: Once | INTRAVENOUS | Status: AC | PRN
  Administered 2013-02-02: 100 mL via INTRAVENOUS

## 2013-02-03 ENCOUNTER — Other Ambulatory Visit: Payer: Self-pay

## 2013-02-03 ENCOUNTER — Encounter (HOSPITAL_COMMUNITY): Payer: Self-pay | Admitting: Cardiology

## 2013-02-03 ENCOUNTER — Emergency Department (HOSPITAL_COMMUNITY): Payer: Medicare Other

## 2013-02-03 ENCOUNTER — Emergency Department (HOSPITAL_COMMUNITY)
Admission: EM | Admit: 2013-02-03 | Discharge: 2013-02-03 | Disposition: A | Discharge status: Home / Self Care | Payer: Medicare Other | Attending: Emergency Medicine | Admitting: Emergency Medicine

## 2013-02-03 DIAGNOSIS — J449 Chronic obstructive pulmonary disease, unspecified: Secondary | ICD-10-CM | POA: Diagnosis not present

## 2013-02-03 DIAGNOSIS — Z8673 Personal history of transient ischemic attack (TIA), and cerebral infarction without residual deficits: Secondary | ICD-10-CM | POA: Diagnosis not present

## 2013-02-03 DIAGNOSIS — J4489 Other specified chronic obstructive pulmonary disease: Secondary | ICD-10-CM | POA: Insufficient documentation

## 2013-02-03 DIAGNOSIS — I10 Essential (primary) hypertension: Secondary | ICD-10-CM | POA: Diagnosis not present

## 2013-02-03 DIAGNOSIS — Z7982 Long term (current) use of aspirin: Secondary | ICD-10-CM | POA: Diagnosis not present

## 2013-02-03 DIAGNOSIS — Z79899 Other long term (current) drug therapy: Secondary | ICD-10-CM | POA: Diagnosis not present

## 2013-02-03 DIAGNOSIS — R079 Chest pain, unspecified: Secondary | ICD-10-CM | POA: Diagnosis not present

## 2013-02-03 DIAGNOSIS — J45909 Unspecified asthma, uncomplicated: Secondary | ICD-10-CM | POA: Insufficient documentation

## 2013-02-03 DIAGNOSIS — R072 Precordial pain: Secondary | ICD-10-CM | POA: Insufficient documentation

## 2013-02-03 LAB — BASIC METABOLIC PANEL
Chloride: 95 mEq/L — ABNORMAL LOW (ref 96–112)
GFR calc Af Amer: 46 mL/min — ABNORMAL LOW (ref >90)
GFR calc non Af Amer: 40 mL/min — ABNORMAL LOW (ref >90)
Potassium: 4.1 mEq/L (ref 3.5–5.1)
Sodium: 134 mEq/L — ABNORMAL LOW (ref 135–145)

## 2013-02-03 LAB — CBC
Platelets: 261 10*3/uL (ref 150–400)
RDW: 13.9 % (ref 11.5–15.5)
WBC: 9.7 10*3/uL (ref 4.0–10.5)

## 2013-02-03 LAB — POCT I-STAT TROPONIN I: Troponin i, poc: 0.01 ng/mL (ref 0.00–0.08)

## 2013-02-03 NOTE — ED Provider Notes (Signed)
History     CSN: [id number]  Arrival date & time 02/03/13  1418   First MD Initiated Contact with Patient 02/03/13 1445      Chief Complaint  Patient presents with  . Chest Pain    (Consider location/radiation/quality/duration/timing/severity/associated sxs/prior treatment) Patient is a 79 y.o. female presenting with chest pain.  Chest Pain  Pt reports sudden onset of severe chest pain a few hours ago while unloading groceries at home. Pain was mid-lower chest, non-radiating. Not associated with nausea, diaphoresis or SOB. No prior history of CAD/ACS. Pain lasted about 3 hours and resolved after taking APAP and 'headache pill'. She was evaluated by PCP earlier this week for LE edema, sent for Echo (which I do not have access to). She and daughter state during the echo they 'saw something go through her heart'. She was subsequently sent for LE doppler and CTA Chest both neg for thromboembolic disease. She is symptom free now.   Past Medical History  Diagnosis Date  . Asthma   . Hypertension   . COPD (chronic obstructive pulmonary disease)   . TIA (transient ischemic attack)   . Carotid artery obstruction     Past Surgical History  Procedure Laterality Date  . Appendectomy      History reviewed. No pertinent family history.  History  Substance Use Topics  . Smoking status: Never Smoker   . Smokeless tobacco: Not on file  . Alcohol Use: No    OB History   Grav Para Term Preterm Abortions TAB SAB Ect Mult Living                  Review of Systems  Cardiovascular: Positive for chest pain.   All other systems reviewed and are negative except as noted in HPI.   Allergies  Aspirin; Lipitor; Other; Penicillins; Statins; Sulfa antibiotics; and Iodine  Home Medications   Current Outpatient Rx  Name  Route  Sig  Dispense  Refill  . acetaminophen (TYLENOL) 500 MG tablet   Oral   Take 500 mg by mouth every 6 (six) hours as needed for pain.         Marland Kitchen ALPRAZolam  (XANAX) 0.25 MG tablet   Oral   Take 0.125 mg by mouth 2 (two) times daily as needed for anxiety.          . Ascorbic Acid (VITAMIN C) 1000 MG tablet   Oral   Take 1,000 mg by mouth daily.         Marland Kitchen aspirin EC 81 MG tablet   Oral   Take 81 mg by mouth every evening.         . Budesonide-Formoterol Fumarate (SYMBICORT IN)   Inhalation   Inhale 2 puffs into the lungs 2 (two) times daily.         . carbamazepine (TEGRETOL) 100 MG chewable tablet   Oral   Chew 100 mg by mouth 3 (three) times daily as needed (for headaches).          . clonazePAM (KLONOPIN) 0.5 MG tablet   Oral   Take 0.5 mg by mouth at bedtime. For anxiety         . cloNIDine (CATAPRES) 0.2 MG tablet   Oral   Take 0.2 mg by mouth at bedtime.         . clopidogrel (PLAVIX) 75 MG tablet   Oral   Take 75 mg by mouth daily.         . felodipine (  PLENDIL) 10 MG 24 hr tablet   Oral   Take 5 mg by mouth daily.         Marland Kitchen levalbuterol (XOPENEX HFA) 45 MCG/ACT inhaler   Inhalation   Inhale 2 puffs into the lungs every 6 (six) hours as needed for wheezing or shortness of breath.          . losartan (COZAAR) 100 MG tablet   Oral   Take 100 mg by mouth daily.         . metoprolol succinate (TOPROL-XL) 25 MG 24 hr tablet   Oral   Take 25 mg by mouth 2 (two) times daily.         Marland Kitchen OLANZapine (ZYPREXA) 5 MG tablet   Oral   Take 2.5 mg by mouth at bedtime.         . potassium gluconate 595 MG TABS   Oral   Take 595 mg by mouth daily.         . theophylline (THEODUR) 300 MG 12 hr tablet   Oral   Take 150 mg by mouth daily.         . vitamin B-12 (CYANOCOBALAMIN) 1000 MCG tablet   Oral   Take 1,000 mcg by mouth daily.         . vitamin E 400 UNIT capsule   Oral   Take 400 Units by mouth daily.           BP 179/73  Pulse 83  Temp(Src) 98.5 F (36.9 C) (Oral)  Resp 18  SpO2 97%  Physical Exam  Nursing note and vitals reviewed. Constitutional: She is oriented to  person, place, and time. She appears well-developed and well-nourished.  HENT:  Head: Normocephalic and atraumatic.  Eyes: EOM are normal. Pupils are equal, round, and reactive to light.  Neck: Normal range of motion. Neck supple.  Cardiovascular: Normal rate, normal heart sounds and intact distal pulses.   Pulmonary/Chest: Effort normal and breath sounds normal.  Abdominal: Bowel sounds are normal. She exhibits no distension. There is no tenderness.  Musculoskeletal: Normal range of motion. She exhibits no edema and no tenderness.  Neurological: She is alert and oriented to person, place, and time. She has normal strength. No cranial nerve deficit or sensory deficit.  Skin: Skin is warm and dry. No rash noted.  Psychiatric: She has a normal mood and affect.    ED Course  Procedures (including critical care time)  Labs Reviewed  BASIC METABOLIC PANEL - Abnormal; Notable for the following:    Sodium 134 (*)    Chloride 95 (*)    Glucose, Bld 124 (*)    BUN 29 (*)    Creatinine, Ser 1.18 (*)    GFR calc non Af Amer 40 (*)    GFR calc Af Amer 46 (*)    All other components within normal limits  CBC  POCT I-STAT TROPONIN I   Dg Chest 2 View  02/03/2013  *RADIOLOGY REPORT*  Clinical Data:  Mid to lower chest pain  CHEST - 2 VIEW  Comparison: Prior chest x-ray 01/08/2010  Findings: Marked pulmonary hyperexpansion with mild central bronchitic changes similar to prior consistent with underlying COPD.  Tortuous, atherosclerotic and mildly ectatic thoracic aorta. Heart remains upper limits of normal for size.  Negative for pulmonary edema had, focal airspace consolidation, pleural effusion or pneumothorax.  Mild biapical scarring.  Circular opacity projecting over the left chest consistent with a radiolucent cardiac lead.  No acute osseous  abnormality.  IMPRESSION:  1.  No acute cardiopulmonary disease 2.  COPD 3.  Borderline cardiomegaly and aortic atherosclerosis   Original Report  Authenticated By: Malachy Moan, M.D.    Ct Angio Chest Pe W/cm &/or Wo Cm  02/02/2013  *RADIOLOGY REPORT*  Clinical Data: Lower extremity edema and elevated D-dimer.  CT ANGIOGRAPHY CHEST  Technique:  Multidetector CT imaging of the chest using the standard protocol during bolus administration of intravenous contrast. Multiplanar reconstructed images including MIPs were obtained and reviewed to evaluate the vascular anatomy.  Contrast: OMNIPAQUE IOHEXOL 350 MG/ML SOLN  Comparison: Chest radiograph 03/16/2011and abdominal CT 02/14/2012  Findings: No evidence for pulmonary embolism.  There is no significant pericardial or pleural fluid.  No evidence for chest lymphadenopathy.  The ascending thoracic aorta is prominent measures up to 3.6 cm.  No evidence for an aortic dissection. Calcifications in the left anterior descending coronary artery. Images of the upper abdomen are unremarkable.  There is a 9 mm low density along the dome of the liver which is probably a cyst or an incidental finding.  The trachea and mainstem bronchi are patent.  There is a small focus of parenchymal disease in the right middle lobe on series 6, image 77.  This area has a branching configuration and well seen on the coronal images.  Maximum size measures 1.5 cm.  Probable scarring at the base of the right middle lobe. No acute bony abnormality.  IMPRESSION: Negative for pulmonary embolism.  There is a small focus of parenchymal disease in the right middle lobe.  Findings are concerning for an area of pneumonia. Consider a 81-month follow-up CT to ensure resolution of this atypical parenchymal disease.   Original Report Authenticated By: Richarda Overlie, M.D.      1. Chest pain       MDM   Date: 02/03/2013  Rate: 84  Rhythm: normal sinus rhythm  QRS Axis: normal  Intervals: normal  ST/T Wave abnormalities: normal  Conduction Disutrbances:none  Narrative Interpretation:   Old EKG Reviewed: unchanged  4:22 PM Note that  CTA above is from yesterday. Labs and imaging today unremarkable including neg Trop x 1. No ischemic changes on EKG. Discuss single negative troponin with the patient and daughter at the bedside and that this is likely inadequate to fully rule out MI and that even in the absence of infarction may be having anginal pain that would require intervention. I discussed admission with the patient, but she is adamant that she wants to go home. She does not want admission or any further ED interventions. She was advised to followup with PCP for further evaluation and to return to the ED immediately for any further chest pains.         Charles B. Bernette Mayers, MD 02/03/13 [phone]

## 2013-02-03 NOTE — ED Notes (Addendum)
Pt to department c/o midsternal chest pain that started about 2 hours ago. Denies any SOB or nausea with the pain. Pt had an echocardiogram done this week and family reports they saw something pass through her heart but they don't know what. Also had an CT angio of her chest this week which was clear also. Pt denies any chest pain at this time. Reports she took tylenol and a headache pill at home. No distress noted at triage. Pt reports she has hx of blockage in the left side of neck. Also reports some swelling in her legs.

## 2013-02-03 NOTE — ED Notes (Signed)
Pt presents with lower, mid-sternal chest pain after running errands this morning.  Pt reports pain lasted approximately 2.5-3 hours, did not radiate.  Pt denies any shortness of breath or nausea.  Pt has BLE edema, but visitor reports swelling is "much better than it was".  Pt reports she is pain free at present, wants to leave, refuses to get into a gown.  Pt states "you can lift my shirt up if you need to, I don't plan on being here long".

## 2013-02-03 NOTE — ED Notes (Signed)
MD at bedside. Erin Cox

## 2013-02-03 NOTE — ED Notes (Signed)
Pt refused to put on a gown and for RN to start IV. Pt states she is ready to go home and is not having any pain.

## 2013-02-23 DIAGNOSIS — E538 Deficiency of other specified B group vitamins: Secondary | ICD-10-CM | POA: Diagnosis not present

## 2013-02-23 DIAGNOSIS — I10 Essential (primary) hypertension: Secondary | ICD-10-CM | POA: Diagnosis not present

## 2013-02-23 DIAGNOSIS — R609 Edema, unspecified: Secondary | ICD-10-CM | POA: Diagnosis not present

## 2013-02-23 DIAGNOSIS — R42 Dizziness and giddiness: Secondary | ICD-10-CM | POA: Diagnosis not present

## 2013-02-23 DIAGNOSIS — G500 Trigeminal neuralgia: Secondary | ICD-10-CM | POA: Diagnosis not present

## 2013-02-23 DIAGNOSIS — J449 Chronic obstructive pulmonary disease, unspecified: Secondary | ICD-10-CM | POA: Diagnosis not present

## 2013-03-28 DIAGNOSIS — Z79899 Other long term (current) drug therapy: Secondary | ICD-10-CM | POA: Diagnosis not present

## 2013-03-28 DIAGNOSIS — R609 Edema, unspecified: Secondary | ICD-10-CM | POA: Diagnosis not present

## 2013-03-28 DIAGNOSIS — Z0000 Encounter for general adult medical examination without abnormal findings: Secondary | ICD-10-CM | POA: Diagnosis not present

## 2013-03-28 DIAGNOSIS — J449 Chronic obstructive pulmonary disease, unspecified: Secondary | ICD-10-CM | POA: Diagnosis not present

## 2013-03-28 DIAGNOSIS — Z1331 Encounter for screening for depression: Secondary | ICD-10-CM | POA: Diagnosis not present

## 2013-03-28 DIAGNOSIS — G500 Trigeminal neuralgia: Secondary | ICD-10-CM | POA: Diagnosis not present

## 2013-03-28 DIAGNOSIS — R42 Dizziness and giddiness: Secondary | ICD-10-CM | POA: Diagnosis not present

## 2013-03-28 DIAGNOSIS — I10 Essential (primary) hypertension: Secondary | ICD-10-CM | POA: Diagnosis not present

## 2013-03-28 DIAGNOSIS — E538 Deficiency of other specified B group vitamins: Secondary | ICD-10-CM | POA: Diagnosis not present

## 2013-03-28 DIAGNOSIS — E46 Unspecified protein-calorie malnutrition: Secondary | ICD-10-CM | POA: Diagnosis not present

## 2013-04-11 DIAGNOSIS — L570 Actinic keratosis: Secondary | ICD-10-CM | POA: Diagnosis not present

## 2013-04-11 DIAGNOSIS — Z85828 Personal history of other malignant neoplasm of skin: Secondary | ICD-10-CM | POA: Diagnosis not present

## 2013-05-10 ENCOUNTER — Ambulatory Visit
Admission: RE | Admit: 2013-05-10 | Discharge: 2013-05-10 | Disposition: A | Discharge status: Home / Self Care | Payer: Medicare Other | Source: Ambulatory Visit | Attending: Internal Medicine | Admitting: Internal Medicine

## 2013-05-10 ENCOUNTER — Other Ambulatory Visit: Payer: Self-pay | Admitting: Internal Medicine

## 2013-05-10 DIAGNOSIS — R1310 Dysphagia, unspecified: Secondary | ICD-10-CM | POA: Diagnosis not present

## 2013-05-10 DIAGNOSIS — I10 Essential (primary) hypertension: Secondary | ICD-10-CM | POA: Diagnosis not present

## 2013-05-10 DIAGNOSIS — R1314 Dysphagia, pharyngoesophageal phase: Secondary | ICD-10-CM | POA: Diagnosis not present

## 2013-05-10 DIAGNOSIS — E46 Unspecified protein-calorie malnutrition: Secondary | ICD-10-CM | POA: Diagnosis not present

## 2013-05-10 DIAGNOSIS — D699 Hemorrhagic condition, unspecified: Secondary | ICD-10-CM | POA: Diagnosis not present

## 2013-05-10 DIAGNOSIS — J449 Chronic obstructive pulmonary disease, unspecified: Secondary | ICD-10-CM | POA: Diagnosis not present

## 2013-05-10 DIAGNOSIS — G500 Trigeminal neuralgia: Secondary | ICD-10-CM | POA: Diagnosis not present

## 2013-06-15 DIAGNOSIS — J449 Chronic obstructive pulmonary disease, unspecified: Secondary | ICD-10-CM | POA: Diagnosis not present

## 2013-06-15 DIAGNOSIS — G500 Trigeminal neuralgia: Secondary | ICD-10-CM | POA: Diagnosis not present

## 2013-06-15 DIAGNOSIS — E46 Unspecified protein-calorie malnutrition: Secondary | ICD-10-CM | POA: Diagnosis not present

## 2013-06-15 DIAGNOSIS — D699 Hemorrhagic condition, unspecified: Secondary | ICD-10-CM | POA: Diagnosis not present

## 2013-06-15 DIAGNOSIS — I10 Essential (primary) hypertension: Secondary | ICD-10-CM | POA: Diagnosis not present

## 2013-06-15 DIAGNOSIS — R1314 Dysphagia, pharyngoesophageal phase: Secondary | ICD-10-CM | POA: Diagnosis not present

## 2013-06-22 DIAGNOSIS — L259 Unspecified contact dermatitis, unspecified cause: Secondary | ICD-10-CM | POA: Diagnosis not present

## 2013-06-22 DIAGNOSIS — L570 Actinic keratosis: Secondary | ICD-10-CM | POA: Diagnosis not present

## 2013-07-31 DIAGNOSIS — Z23 Encounter for immunization: Secondary | ICD-10-CM | POA: Diagnosis not present

## 2013-10-10 DIAGNOSIS — C44221 Squamous cell carcinoma of skin of unspecified ear and external auricular canal: Secondary | ICD-10-CM | POA: Diagnosis not present

## 2013-10-10 DIAGNOSIS — D0439 Carcinoma in situ of skin of other parts of face: Secondary | ICD-10-CM | POA: Diagnosis not present

## 2013-10-10 DIAGNOSIS — L570 Actinic keratosis: Secondary | ICD-10-CM | POA: Diagnosis not present

## 2013-11-21 DIAGNOSIS — Z85828 Personal history of other malignant neoplasm of skin: Secondary | ICD-10-CM | POA: Diagnosis not present

## 2014-01-31 DIAGNOSIS — L723 Sebaceous cyst: Secondary | ICD-10-CM | POA: Diagnosis not present

## 2014-02-17 ENCOUNTER — Encounter: Payer: Self-pay | Admitting: *Deleted

## 2014-02-20 DIAGNOSIS — D485 Neoplasm of uncertain behavior of skin: Secondary | ICD-10-CM | POA: Diagnosis not present

## 2014-02-20 DIAGNOSIS — Z85828 Personal history of other malignant neoplasm of skin: Secondary | ICD-10-CM | POA: Diagnosis not present

## 2014-04-12 DIAGNOSIS — I10 Essential (primary) hypertension: Secondary | ICD-10-CM | POA: Diagnosis not present

## 2014-04-12 DIAGNOSIS — R1314 Dysphagia, pharyngoesophageal phase: Secondary | ICD-10-CM | POA: Diagnosis not present

## 2014-04-12 DIAGNOSIS — Z1331 Encounter for screening for depression: Secondary | ICD-10-CM | POA: Diagnosis not present

## 2014-04-12 DIAGNOSIS — E538 Deficiency of other specified B group vitamins: Secondary | ICD-10-CM | POA: Diagnosis not present

## 2014-04-12 DIAGNOSIS — E46 Unspecified protein-calorie malnutrition: Secondary | ICD-10-CM | POA: Diagnosis not present

## 2014-04-12 DIAGNOSIS — J449 Chronic obstructive pulmonary disease, unspecified: Secondary | ICD-10-CM | POA: Diagnosis not present

## 2014-04-12 DIAGNOSIS — D699 Hemorrhagic condition, unspecified: Secondary | ICD-10-CM | POA: Diagnosis not present

## 2014-04-12 DIAGNOSIS — Z0000 Encounter for general adult medical examination without abnormal findings: Secondary | ICD-10-CM | POA: Diagnosis not present

## 2014-04-12 DIAGNOSIS — R609 Edema, unspecified: Secondary | ICD-10-CM | POA: Diagnosis not present

## 2014-04-12 DIAGNOSIS — Z23 Encounter for immunization: Secondary | ICD-10-CM | POA: Diagnosis not present

## 2014-04-19 DIAGNOSIS — E871 Hypo-osmolality and hyponatremia: Secondary | ICD-10-CM | POA: Diagnosis not present

## 2014-04-24 DIAGNOSIS — E46 Unspecified protein-calorie malnutrition: Secondary | ICD-10-CM | POA: Diagnosis not present

## 2014-04-24 DIAGNOSIS — I10 Essential (primary) hypertension: Secondary | ICD-10-CM | POA: Diagnosis not present

## 2014-04-24 DIAGNOSIS — J449 Chronic obstructive pulmonary disease, unspecified: Secondary | ICD-10-CM | POA: Diagnosis not present

## 2014-04-24 DIAGNOSIS — N183 Chronic kidney disease, stage 3 unspecified: Secondary | ICD-10-CM | POA: Diagnosis not present

## 2014-04-24 DIAGNOSIS — E871 Hypo-osmolality and hyponatremia: Secondary | ICD-10-CM | POA: Diagnosis not present

## 2014-05-08 DIAGNOSIS — E871 Hypo-osmolality and hyponatremia: Secondary | ICD-10-CM | POA: Diagnosis not present

## 2014-06-05 DIAGNOSIS — E871 Hypo-osmolality and hyponatremia: Secondary | ICD-10-CM | POA: Diagnosis not present

## 2014-06-05 DIAGNOSIS — N183 Chronic kidney disease, stage 3 unspecified: Secondary | ICD-10-CM | POA: Diagnosis not present

## 2014-06-05 DIAGNOSIS — J449 Chronic obstructive pulmonary disease, unspecified: Secondary | ICD-10-CM | POA: Diagnosis not present

## 2014-06-05 DIAGNOSIS — I10 Essential (primary) hypertension: Secondary | ICD-10-CM | POA: Diagnosis not present

## 2014-06-05 DIAGNOSIS — E46 Unspecified protein-calorie malnutrition: Secondary | ICD-10-CM | POA: Diagnosis not present

## 2014-06-05 DIAGNOSIS — I672 Cerebral atherosclerosis: Secondary | ICD-10-CM | POA: Diagnosis not present

## 2014-06-05 DIAGNOSIS — G500 Trigeminal neuralgia: Secondary | ICD-10-CM | POA: Diagnosis not present

## 2014-08-08 DIAGNOSIS — Z23 Encounter for immunization: Secondary | ICD-10-CM | POA: Diagnosis not present

## 2014-08-21 DIAGNOSIS — L570 Actinic keratosis: Secondary | ICD-10-CM | POA: Diagnosis not present

## 2014-08-29 DIAGNOSIS — L578 Other skin changes due to chronic exposure to nonionizing radiation: Secondary | ICD-10-CM | POA: Diagnosis not present

## 2014-10-09 DIAGNOSIS — R413 Other amnesia: Secondary | ICD-10-CM | POA: Diagnosis not present

## 2014-10-09 DIAGNOSIS — G4700 Insomnia, unspecified: Secondary | ICD-10-CM | POA: Diagnosis not present

## 2014-10-09 DIAGNOSIS — R634 Abnormal weight loss: Secondary | ICD-10-CM | POA: Diagnosis not present

## 2014-10-09 DIAGNOSIS — I672 Cerebral atherosclerosis: Secondary | ICD-10-CM | POA: Diagnosis not present

## 2014-10-09 DIAGNOSIS — Z79899 Other long term (current) drug therapy: Secondary | ICD-10-CM | POA: Diagnosis not present

## 2014-10-09 DIAGNOSIS — F419 Anxiety disorder, unspecified: Secondary | ICD-10-CM | POA: Diagnosis not present

## 2014-10-09 DIAGNOSIS — I10 Essential (primary) hypertension: Secondary | ICD-10-CM | POA: Diagnosis not present

## 2014-12-17 DIAGNOSIS — C44329 Squamous cell carcinoma of skin of other parts of face: Secondary | ICD-10-CM | POA: Diagnosis not present

## 2015-01-25 DIAGNOSIS — R413 Other amnesia: Secondary | ICD-10-CM | POA: Diagnosis not present

## 2015-01-25 DIAGNOSIS — I672 Cerebral atherosclerosis: Secondary | ICD-10-CM | POA: Diagnosis not present

## 2015-01-25 DIAGNOSIS — J449 Chronic obstructive pulmonary disease, unspecified: Secondary | ICD-10-CM | POA: Diagnosis not present

## 2015-01-25 DIAGNOSIS — F419 Anxiety disorder, unspecified: Secondary | ICD-10-CM | POA: Diagnosis not present

## 2015-01-25 DIAGNOSIS — R1314 Dysphagia, pharyngoesophageal phase: Secondary | ICD-10-CM | POA: Diagnosis not present

## 2015-01-25 DIAGNOSIS — N183 Chronic kidney disease, stage 3 (moderate): Secondary | ICD-10-CM | POA: Diagnosis not present

## 2015-01-25 DIAGNOSIS — G4700 Insomnia, unspecified: Secondary | ICD-10-CM | POA: Diagnosis not present

## 2015-01-25 DIAGNOSIS — D81818 Other biotin-dependent carboxylase deficiency: Secondary | ICD-10-CM | POA: Diagnosis not present

## 2015-01-25 DIAGNOSIS — E46 Unspecified protein-calorie malnutrition: Secondary | ICD-10-CM | POA: Diagnosis not present

## 2015-01-25 DIAGNOSIS — I10 Essential (primary) hypertension: Secondary | ICD-10-CM | POA: Diagnosis not present

## 2015-01-25 DIAGNOSIS — R634 Abnormal weight loss: Secondary | ICD-10-CM | POA: Diagnosis not present

## 2015-07-09 DIAGNOSIS — Z79899 Other long term (current) drug therapy: Secondary | ICD-10-CM | POA: Diagnosis not present

## 2015-07-09 DIAGNOSIS — Z1389 Encounter for screening for other disorder: Secondary | ICD-10-CM | POA: Diagnosis not present

## 2015-07-09 DIAGNOSIS — D81818 Other biotin-dependent carboxylase deficiency: Secondary | ICD-10-CM | POA: Diagnosis not present

## 2015-07-09 DIAGNOSIS — E46 Unspecified protein-calorie malnutrition: Secondary | ICD-10-CM | POA: Diagnosis not present

## 2015-07-09 DIAGNOSIS — Z23 Encounter for immunization: Secondary | ICD-10-CM | POA: Diagnosis not present

## 2015-07-09 DIAGNOSIS — J449 Chronic obstructive pulmonary disease, unspecified: Secondary | ICD-10-CM | POA: Diagnosis not present

## 2015-07-09 DIAGNOSIS — I10 Essential (primary) hypertension: Secondary | ICD-10-CM | POA: Diagnosis not present

## 2015-07-09 DIAGNOSIS — Z0001 Encounter for general adult medical examination with abnormal findings: Secondary | ICD-10-CM | POA: Diagnosis not present

## 2015-07-09 DIAGNOSIS — N183 Chronic kidney disease, stage 3 (moderate): Secondary | ICD-10-CM | POA: Diagnosis not present

## 2015-07-09 DIAGNOSIS — I672 Cerebral atherosclerosis: Secondary | ICD-10-CM | POA: Diagnosis not present

## 2015-07-09 DIAGNOSIS — R634 Abnormal weight loss: Secondary | ICD-10-CM | POA: Diagnosis not present

## 2015-09-18 DIAGNOSIS — E860 Dehydration: Secondary | ICD-10-CM | POA: Diagnosis not present

## 2015-09-18 DIAGNOSIS — R5383 Other fatigue: Secondary | ICD-10-CM | POA: Diagnosis not present

## 2015-09-23 DIAGNOSIS — G500 Trigeminal neuralgia: Secondary | ICD-10-CM | POA: Diagnosis not present

## 2015-09-23 DIAGNOSIS — F419 Anxiety disorder, unspecified: Secondary | ICD-10-CM | POA: Diagnosis not present

## 2015-09-23 DIAGNOSIS — J441 Chronic obstructive pulmonary disease with (acute) exacerbation: Secondary | ICD-10-CM | POA: Diagnosis not present

## 2015-09-23 DIAGNOSIS — R413 Other amnesia: Secondary | ICD-10-CM | POA: Diagnosis not present

## 2015-09-23 DIAGNOSIS — J449 Chronic obstructive pulmonary disease, unspecified: Secondary | ICD-10-CM | POA: Diagnosis not present

## 2015-09-23 DIAGNOSIS — I10 Essential (primary) hypertension: Secondary | ICD-10-CM | POA: Diagnosis not present

## 2016-02-10 DIAGNOSIS — R55 Syncope and collapse: Secondary | ICD-10-CM | POA: Diagnosis not present

## 2016-02-10 DIAGNOSIS — M545 Low back pain: Secondary | ICD-10-CM | POA: Diagnosis not present

## 2016-02-10 DIAGNOSIS — I10 Essential (primary) hypertension: Secondary | ICD-10-CM | POA: Diagnosis not present

## 2016-06-15 DIAGNOSIS — L821 Other seborrheic keratosis: Secondary | ICD-10-CM | POA: Diagnosis not present

## 2016-07-16 DIAGNOSIS — I10 Essential (primary) hypertension: Secondary | ICD-10-CM | POA: Diagnosis not present

## 2016-07-16 DIAGNOSIS — Z1389 Encounter for screening for other disorder: Secondary | ICD-10-CM | POA: Diagnosis not present

## 2016-07-16 DIAGNOSIS — E46 Unspecified protein-calorie malnutrition: Secondary | ICD-10-CM | POA: Diagnosis not present

## 2016-07-16 DIAGNOSIS — M545 Low back pain: Secondary | ICD-10-CM | POA: Diagnosis not present

## 2016-07-16 DIAGNOSIS — N183 Chronic kidney disease, stage 3 (moderate): Secondary | ICD-10-CM | POA: Diagnosis not present

## 2016-07-16 DIAGNOSIS — Z23 Encounter for immunization: Secondary | ICD-10-CM | POA: Diagnosis not present

## 2016-07-16 DIAGNOSIS — Z0001 Encounter for general adult medical examination with abnormal findings: Secondary | ICD-10-CM | POA: Diagnosis not present

## 2016-07-16 DIAGNOSIS — R634 Abnormal weight loss: Secondary | ICD-10-CM | POA: Diagnosis not present

## 2016-07-16 DIAGNOSIS — D81818 Other biotin-dependent carboxylase deficiency: Secondary | ICD-10-CM | POA: Diagnosis not present

## 2016-07-16 DIAGNOSIS — I672 Cerebral atherosclerosis: Secondary | ICD-10-CM | POA: Diagnosis not present

## 2016-07-16 DIAGNOSIS — J449 Chronic obstructive pulmonary disease, unspecified: Secondary | ICD-10-CM | POA: Diagnosis not present

## 2017-01-18 DIAGNOSIS — N183 Chronic kidney disease, stage 3 (moderate): Secondary | ICD-10-CM | POA: Diagnosis not present

## 2017-01-18 DIAGNOSIS — E46 Unspecified protein-calorie malnutrition: Secondary | ICD-10-CM | POA: Diagnosis not present

## 2017-01-18 DIAGNOSIS — J449 Chronic obstructive pulmonary disease, unspecified: Secondary | ICD-10-CM | POA: Diagnosis not present

## 2017-01-18 DIAGNOSIS — I10 Essential (primary) hypertension: Secondary | ICD-10-CM | POA: Diagnosis not present

## 2017-01-18 DIAGNOSIS — F419 Anxiety disorder, unspecified: Secondary | ICD-10-CM | POA: Diagnosis not present

## 2017-02-01 DIAGNOSIS — J45909 Unspecified asthma, uncomplicated: Secondary | ICD-10-CM | POA: Diagnosis not present

## 2017-02-01 DIAGNOSIS — Z7902 Long term (current) use of antithrombotics/antiplatelets: Secondary | ICD-10-CM | POA: Diagnosis not present

## 2017-02-01 DIAGNOSIS — N183 Chronic kidney disease, stage 3 (moderate): Secondary | ICD-10-CM | POA: Diagnosis not present

## 2017-02-01 DIAGNOSIS — Z7982 Long term (current) use of aspirin: Secondary | ICD-10-CM | POA: Diagnosis not present

## 2017-02-01 DIAGNOSIS — F418 Other specified anxiety disorders: Secondary | ICD-10-CM | POA: Diagnosis not present

## 2017-02-01 DIAGNOSIS — J449 Chronic obstructive pulmonary disease, unspecified: Secondary | ICD-10-CM | POA: Diagnosis not present

## 2017-02-01 DIAGNOSIS — M170 Bilateral primary osteoarthritis of knee: Secondary | ICD-10-CM | POA: Diagnosis not present

## 2017-02-01 DIAGNOSIS — E46 Unspecified protein-calorie malnutrition: Secondary | ICD-10-CM | POA: Diagnosis not present

## 2017-02-01 DIAGNOSIS — I129 Hypertensive chronic kidney disease with stage 1 through stage 4 chronic kidney disease, or unspecified chronic kidney disease: Secondary | ICD-10-CM | POA: Diagnosis not present

## 2017-02-03 DIAGNOSIS — J449 Chronic obstructive pulmonary disease, unspecified: Secondary | ICD-10-CM | POA: Diagnosis not present

## 2017-02-03 DIAGNOSIS — F418 Other specified anxiety disorders: Secondary | ICD-10-CM | POA: Diagnosis not present

## 2017-02-03 DIAGNOSIS — N183 Chronic kidney disease, stage 3 (moderate): Secondary | ICD-10-CM | POA: Diagnosis not present

## 2017-02-03 DIAGNOSIS — I129 Hypertensive chronic kidney disease with stage 1 through stage 4 chronic kidney disease, or unspecified chronic kidney disease: Secondary | ICD-10-CM | POA: Diagnosis not present

## 2017-02-03 DIAGNOSIS — J45909 Unspecified asthma, uncomplicated: Secondary | ICD-10-CM | POA: Diagnosis not present

## 2017-02-03 DIAGNOSIS — M170 Bilateral primary osteoarthritis of knee: Secondary | ICD-10-CM | POA: Diagnosis not present

## 2017-02-04 DIAGNOSIS — J449 Chronic obstructive pulmonary disease, unspecified: Secondary | ICD-10-CM | POA: Diagnosis not present

## 2017-02-04 DIAGNOSIS — N183 Chronic kidney disease, stage 3 (moderate): Secondary | ICD-10-CM | POA: Diagnosis not present

## 2017-02-04 DIAGNOSIS — M170 Bilateral primary osteoarthritis of knee: Secondary | ICD-10-CM | POA: Diagnosis not present

## 2017-02-04 DIAGNOSIS — I129 Hypertensive chronic kidney disease with stage 1 through stage 4 chronic kidney disease, or unspecified chronic kidney disease: Secondary | ICD-10-CM | POA: Diagnosis not present

## 2017-02-04 DIAGNOSIS — F418 Other specified anxiety disorders: Secondary | ICD-10-CM | POA: Diagnosis not present

## 2017-02-04 DIAGNOSIS — J45909 Unspecified asthma, uncomplicated: Secondary | ICD-10-CM | POA: Diagnosis not present

## 2017-02-09 DIAGNOSIS — J45909 Unspecified asthma, uncomplicated: Secondary | ICD-10-CM | POA: Diagnosis not present

## 2017-02-09 DIAGNOSIS — J449 Chronic obstructive pulmonary disease, unspecified: Secondary | ICD-10-CM | POA: Diagnosis not present

## 2017-02-09 DIAGNOSIS — I129 Hypertensive chronic kidney disease with stage 1 through stage 4 chronic kidney disease, or unspecified chronic kidney disease: Secondary | ICD-10-CM | POA: Diagnosis not present

## 2017-02-09 DIAGNOSIS — M170 Bilateral primary osteoarthritis of knee: Secondary | ICD-10-CM | POA: Diagnosis not present

## 2017-02-09 DIAGNOSIS — N183 Chronic kidney disease, stage 3 (moderate): Secondary | ICD-10-CM | POA: Diagnosis not present

## 2017-02-09 DIAGNOSIS — F418 Other specified anxiety disorders: Secondary | ICD-10-CM | POA: Diagnosis not present

## 2017-02-11 DIAGNOSIS — N183 Chronic kidney disease, stage 3 (moderate): Secondary | ICD-10-CM | POA: Diagnosis not present

## 2017-02-11 DIAGNOSIS — I129 Hypertensive chronic kidney disease with stage 1 through stage 4 chronic kidney disease, or unspecified chronic kidney disease: Secondary | ICD-10-CM | POA: Diagnosis not present

## 2017-02-11 DIAGNOSIS — J45909 Unspecified asthma, uncomplicated: Secondary | ICD-10-CM | POA: Diagnosis not present

## 2017-02-11 DIAGNOSIS — J449 Chronic obstructive pulmonary disease, unspecified: Secondary | ICD-10-CM | POA: Diagnosis not present

## 2017-02-11 DIAGNOSIS — F418 Other specified anxiety disorders: Secondary | ICD-10-CM | POA: Diagnosis not present

## 2017-02-11 DIAGNOSIS — M170 Bilateral primary osteoarthritis of knee: Secondary | ICD-10-CM | POA: Diagnosis not present

## 2017-02-16 DIAGNOSIS — J449 Chronic obstructive pulmonary disease, unspecified: Secondary | ICD-10-CM | POA: Diagnosis not present

## 2017-02-16 DIAGNOSIS — M170 Bilateral primary osteoarthritis of knee: Secondary | ICD-10-CM | POA: Diagnosis not present

## 2017-02-16 DIAGNOSIS — J45909 Unspecified asthma, uncomplicated: Secondary | ICD-10-CM | POA: Diagnosis not present

## 2017-02-16 DIAGNOSIS — F418 Other specified anxiety disorders: Secondary | ICD-10-CM | POA: Diagnosis not present

## 2017-02-16 DIAGNOSIS — I129 Hypertensive chronic kidney disease with stage 1 through stage 4 chronic kidney disease, or unspecified chronic kidney disease: Secondary | ICD-10-CM | POA: Diagnosis not present

## 2017-02-16 DIAGNOSIS — N183 Chronic kidney disease, stage 3 (moderate): Secondary | ICD-10-CM | POA: Diagnosis not present

## 2017-02-18 DIAGNOSIS — M170 Bilateral primary osteoarthritis of knee: Secondary | ICD-10-CM | POA: Diagnosis not present

## 2017-02-18 DIAGNOSIS — J45909 Unspecified asthma, uncomplicated: Secondary | ICD-10-CM | POA: Diagnosis not present

## 2017-02-18 DIAGNOSIS — I129 Hypertensive chronic kidney disease with stage 1 through stage 4 chronic kidney disease, or unspecified chronic kidney disease: Secondary | ICD-10-CM | POA: Diagnosis not present

## 2017-02-18 DIAGNOSIS — F418 Other specified anxiety disorders: Secondary | ICD-10-CM | POA: Diagnosis not present

## 2017-02-18 DIAGNOSIS — N183 Chronic kidney disease, stage 3 (moderate): Secondary | ICD-10-CM | POA: Diagnosis not present

## 2017-02-18 DIAGNOSIS — J449 Chronic obstructive pulmonary disease, unspecified: Secondary | ICD-10-CM | POA: Diagnosis not present

## 2017-02-25 DIAGNOSIS — J45909 Unspecified asthma, uncomplicated: Secondary | ICD-10-CM | POA: Diagnosis not present

## 2017-02-25 DIAGNOSIS — J449 Chronic obstructive pulmonary disease, unspecified: Secondary | ICD-10-CM | POA: Diagnosis not present

## 2017-02-25 DIAGNOSIS — N183 Chronic kidney disease, stage 3 (moderate): Secondary | ICD-10-CM | POA: Diagnosis not present

## 2017-02-25 DIAGNOSIS — I129 Hypertensive chronic kidney disease with stage 1 through stage 4 chronic kidney disease, or unspecified chronic kidney disease: Secondary | ICD-10-CM | POA: Diagnosis not present

## 2017-02-25 DIAGNOSIS — F418 Other specified anxiety disorders: Secondary | ICD-10-CM | POA: Diagnosis not present

## 2017-02-25 DIAGNOSIS — M170 Bilateral primary osteoarthritis of knee: Secondary | ICD-10-CM | POA: Diagnosis not present

## 2017-02-26 DIAGNOSIS — J45909 Unspecified asthma, uncomplicated: Secondary | ICD-10-CM | POA: Diagnosis not present

## 2017-02-26 DIAGNOSIS — N183 Chronic kidney disease, stage 3 (moderate): Secondary | ICD-10-CM | POA: Diagnosis not present

## 2017-02-26 DIAGNOSIS — M170 Bilateral primary osteoarthritis of knee: Secondary | ICD-10-CM | POA: Diagnosis not present

## 2017-02-26 DIAGNOSIS — F418 Other specified anxiety disorders: Secondary | ICD-10-CM | POA: Diagnosis not present

## 2017-02-26 DIAGNOSIS — I129 Hypertensive chronic kidney disease with stage 1 through stage 4 chronic kidney disease, or unspecified chronic kidney disease: Secondary | ICD-10-CM | POA: Diagnosis not present

## 2017-02-26 DIAGNOSIS — J449 Chronic obstructive pulmonary disease, unspecified: Secondary | ICD-10-CM | POA: Diagnosis not present

## 2017-03-02 DIAGNOSIS — J45909 Unspecified asthma, uncomplicated: Secondary | ICD-10-CM | POA: Diagnosis not present

## 2017-03-02 DIAGNOSIS — N183 Chronic kidney disease, stage 3 (moderate): Secondary | ICD-10-CM | POA: Diagnosis not present

## 2017-03-02 DIAGNOSIS — J449 Chronic obstructive pulmonary disease, unspecified: Secondary | ICD-10-CM | POA: Diagnosis not present

## 2017-03-02 DIAGNOSIS — M170 Bilateral primary osteoarthritis of knee: Secondary | ICD-10-CM | POA: Diagnosis not present

## 2017-03-02 DIAGNOSIS — F418 Other specified anxiety disorders: Secondary | ICD-10-CM | POA: Diagnosis not present

## 2017-03-02 DIAGNOSIS — I129 Hypertensive chronic kidney disease with stage 1 through stage 4 chronic kidney disease, or unspecified chronic kidney disease: Secondary | ICD-10-CM | POA: Diagnosis not present

## 2017-03-04 DIAGNOSIS — N183 Chronic kidney disease, stage 3 (moderate): Secondary | ICD-10-CM | POA: Diagnosis not present

## 2017-03-04 DIAGNOSIS — I129 Hypertensive chronic kidney disease with stage 1 through stage 4 chronic kidney disease, or unspecified chronic kidney disease: Secondary | ICD-10-CM | POA: Diagnosis not present

## 2017-03-04 DIAGNOSIS — F418 Other specified anxiety disorders: Secondary | ICD-10-CM | POA: Diagnosis not present

## 2017-03-04 DIAGNOSIS — J45909 Unspecified asthma, uncomplicated: Secondary | ICD-10-CM | POA: Diagnosis not present

## 2017-03-04 DIAGNOSIS — M170 Bilateral primary osteoarthritis of knee: Secondary | ICD-10-CM | POA: Diagnosis not present

## 2017-03-04 DIAGNOSIS — J449 Chronic obstructive pulmonary disease, unspecified: Secondary | ICD-10-CM | POA: Diagnosis not present

## 2017-08-03 DIAGNOSIS — R634 Abnormal weight loss: Secondary | ICD-10-CM | POA: Diagnosis not present

## 2017-08-03 DIAGNOSIS — N183 Chronic kidney disease, stage 3 (moderate): Secondary | ICD-10-CM | POA: Diagnosis not present

## 2017-08-03 DIAGNOSIS — G500 Trigeminal neuralgia: Secondary | ICD-10-CM | POA: Diagnosis not present

## 2017-08-03 DIAGNOSIS — Z0000 Encounter for general adult medical examination without abnormal findings: Secondary | ICD-10-CM | POA: Diagnosis not present

## 2017-08-03 DIAGNOSIS — J449 Chronic obstructive pulmonary disease, unspecified: Secondary | ICD-10-CM | POA: Diagnosis not present

## 2017-08-03 DIAGNOSIS — E46 Unspecified protein-calorie malnutrition: Secondary | ICD-10-CM | POA: Diagnosis not present

## 2017-08-03 DIAGNOSIS — Z1389 Encounter for screening for other disorder: Secondary | ICD-10-CM | POA: Diagnosis not present

## 2017-08-03 DIAGNOSIS — I6529 Occlusion and stenosis of unspecified carotid artery: Secondary | ICD-10-CM | POA: Diagnosis not present

## 2017-08-03 DIAGNOSIS — Z79899 Other long term (current) drug therapy: Secondary | ICD-10-CM | POA: Diagnosis not present

## 2017-08-03 DIAGNOSIS — R413 Other amnesia: Secondary | ICD-10-CM | POA: Diagnosis not present

## 2017-08-03 DIAGNOSIS — I672 Cerebral atherosclerosis: Secondary | ICD-10-CM | POA: Diagnosis not present

## 2017-08-03 DIAGNOSIS — D81818 Other biotin-dependent carboxylase deficiency: Secondary | ICD-10-CM | POA: Diagnosis not present

## 2017-08-03 DIAGNOSIS — I10 Essential (primary) hypertension: Secondary | ICD-10-CM | POA: Diagnosis not present

## 2017-08-03 DIAGNOSIS — G4700 Insomnia, unspecified: Secondary | ICD-10-CM | POA: Diagnosis not present

## 2017-08-03 DIAGNOSIS — Z23 Encounter for immunization: Secondary | ICD-10-CM | POA: Diagnosis not present

## 2017-08-03 DIAGNOSIS — F419 Anxiety disorder, unspecified: Secondary | ICD-10-CM | POA: Diagnosis not present

## 2018-02-10 ENCOUNTER — Emergency Department (HOSPITAL_COMMUNITY): Payer: Medicare Other

## 2018-02-10 ENCOUNTER — Other Ambulatory Visit: Payer: Self-pay

## 2018-02-10 ENCOUNTER — Encounter (HOSPITAL_COMMUNITY): Payer: Self-pay

## 2018-02-10 ENCOUNTER — Inpatient Hospital Stay (HOSPITAL_COMMUNITY)
Admission: EM | Admit: 2018-02-10 | Discharge: 2018-02-14 | DRG: 064 | Disposition: A | Discharge status: Skilled Nursing Facility | Payer: Medicare Other | Attending: Internal Medicine | Admitting: Internal Medicine

## 2018-02-10 DIAGNOSIS — F419 Anxiety disorder, unspecified: Secondary | ICD-10-CM | POA: Diagnosis not present

## 2018-02-10 DIAGNOSIS — N183 Chronic kidney disease, stage 3 unspecified: Secondary | ICD-10-CM | POA: Diagnosis present

## 2018-02-10 DIAGNOSIS — I6522 Occlusion and stenosis of left carotid artery: Secondary | ICD-10-CM

## 2018-02-10 DIAGNOSIS — I5043 Acute on chronic combined systolic (congestive) and diastolic (congestive) heart failure: Secondary | ICD-10-CM | POA: Diagnosis not present

## 2018-02-10 DIAGNOSIS — I10 Essential (primary) hypertension: Secondary | ICD-10-CM | POA: Diagnosis not present

## 2018-02-10 DIAGNOSIS — E440 Moderate protein-calorie malnutrition: Secondary | ICD-10-CM | POA: Diagnosis present

## 2018-02-10 DIAGNOSIS — G8194 Hemiplegia, unspecified affecting left nondominant side: Secondary | ICD-10-CM | POA: Diagnosis not present

## 2018-02-10 DIAGNOSIS — E785 Hyperlipidemia, unspecified: Secondary | ICD-10-CM | POA: Diagnosis not present

## 2018-02-10 DIAGNOSIS — Z79899 Other long term (current) drug therapy: Secondary | ICD-10-CM | POA: Diagnosis not present

## 2018-02-10 DIAGNOSIS — J449 Chronic obstructive pulmonary disease, unspecified: Secondary | ICD-10-CM | POA: Diagnosis not present

## 2018-02-10 DIAGNOSIS — Z7982 Long term (current) use of aspirin: Secondary | ICD-10-CM | POA: Diagnosis not present

## 2018-02-10 DIAGNOSIS — I6302 Cerebral infarction due to thrombosis of basilar artery: Secondary | ICD-10-CM | POA: Diagnosis not present

## 2018-02-10 DIAGNOSIS — Z6823 Body mass index (BMI) 23.0-23.9, adult: Secondary | ICD-10-CM | POA: Diagnosis not present

## 2018-02-10 DIAGNOSIS — I6602 Occlusion and stenosis of left middle cerebral artery: Secondary | ICD-10-CM | POA: Diagnosis not present

## 2018-02-10 DIAGNOSIS — Z66 Do not resuscitate: Secondary | ICD-10-CM | POA: Diagnosis present

## 2018-02-10 DIAGNOSIS — G9341 Metabolic encephalopathy: Secondary | ICD-10-CM | POA: Diagnosis present

## 2018-02-10 DIAGNOSIS — R1312 Dysphagia, oropharyngeal phase: Secondary | ICD-10-CM | POA: Diagnosis not present

## 2018-02-10 DIAGNOSIS — Z8673 Personal history of transient ischemic attack (TIA), and cerebral infarction without residual deficits: Secondary | ICD-10-CM | POA: Diagnosis not present

## 2018-02-10 DIAGNOSIS — R350 Frequency of micturition: Secondary | ICD-10-CM | POA: Diagnosis present

## 2018-02-10 DIAGNOSIS — I361 Nonrheumatic tricuspid (valve) insufficiency: Secondary | ICD-10-CM | POA: Diagnosis not present

## 2018-02-10 DIAGNOSIS — R410 Disorientation, unspecified: Secondary | ICD-10-CM | POA: Diagnosis not present

## 2018-02-10 DIAGNOSIS — I639 Cerebral infarction, unspecified: Secondary | ICD-10-CM | POA: Diagnosis not present

## 2018-02-10 DIAGNOSIS — I129 Hypertensive chronic kidney disease with stage 1 through stage 4 chronic kidney disease, or unspecified chronic kidney disease: Secondary | ICD-10-CM | POA: Diagnosis present

## 2018-02-10 DIAGNOSIS — Z888 Allergy status to other drugs, medicaments and biological substances status: Secondary | ICD-10-CM | POA: Diagnosis not present

## 2018-02-10 DIAGNOSIS — M6389 Disorders of muscle in diseases classified elsewhere, multiple sites: Secondary | ICD-10-CM | POA: Diagnosis not present

## 2018-02-10 DIAGNOSIS — R471 Dysarthria and anarthria: Secondary | ICD-10-CM | POA: Diagnosis present

## 2018-02-10 DIAGNOSIS — I2510 Atherosclerotic heart disease of native coronary artery without angina pectoris: Secondary | ICD-10-CM | POA: Diagnosis present

## 2018-02-10 DIAGNOSIS — R278 Other lack of coordination: Secondary | ICD-10-CM | POA: Diagnosis not present

## 2018-02-10 DIAGNOSIS — E43 Unspecified severe protein-calorie malnutrition: Secondary | ICD-10-CM | POA: Diagnosis present

## 2018-02-10 DIAGNOSIS — Z7902 Long term (current) use of antithrombotics/antiplatelets: Secondary | ICD-10-CM

## 2018-02-10 DIAGNOSIS — M6281 Muscle weakness (generalized): Secondary | ICD-10-CM | POA: Diagnosis not present

## 2018-02-10 DIAGNOSIS — R41841 Cognitive communication deficit: Secondary | ICD-10-CM | POA: Diagnosis not present

## 2018-02-10 DIAGNOSIS — R2681 Unsteadiness on feet: Secondary | ICD-10-CM | POA: Diagnosis not present

## 2018-02-10 LAB — DIFFERENTIAL
BASOS ABS: 0.1 10*3/uL (ref 0.0–0.1)
Basophils Relative: 1 %
Eosinophils Absolute: 0.1 10*3/uL (ref 0.0–0.7)
Eosinophils Relative: 1 %
LYMPHS ABS: 1 10*3/uL (ref 0.7–4.0)
LYMPHS PCT: 16 %
MONO ABS: 0.6 10*3/uL (ref 0.1–1.0)
MONOS PCT: 9 %
NEUTROS ABS: 4.5 10*3/uL (ref 1.7–7.7)
Neutrophils Relative %: 73 %

## 2018-02-10 LAB — URINALYSIS, ROUTINE W REFLEX MICROSCOPIC
Bacteria, UA: NONE SEEN
Bilirubin Urine: NEGATIVE
Glucose, UA: NEGATIVE mg/dL
Hgb urine dipstick: NEGATIVE
KETONES UR: NEGATIVE mg/dL
Leukocytes, UA: NEGATIVE
Nitrite: NEGATIVE
PROTEIN: 30 mg/dL — AB
Specific Gravity, Urine: 1.013 (ref 1.005–1.030)
pH: 7 (ref 5.0–8.0)

## 2018-02-10 LAB — CBC
HCT: 40.8 % (ref 36.0–46.0)
Hemoglobin: 12.9 g/dL (ref 12.0–15.0)
MCH: 30.3 pg (ref 26.0–34.0)
MCHC: 31.6 g/dL (ref 30.0–36.0)
MCV: 95.8 fL (ref 78.0–100.0)
Platelets: 233 10*3/uL (ref 150–400)
RBC: 4.26 MIL/uL (ref 3.87–5.11)
RDW: 14.3 % (ref 11.5–15.5)
WBC: 6.2 10*3/uL (ref 4.0–10.5)

## 2018-02-10 LAB — COMPREHENSIVE METABOLIC PANEL
ALK PHOS: 102 U/L (ref 38–126)
ALT: 15 U/L (ref 14–54)
AST: 24 U/L (ref 15–41)
Albumin: 4.1 g/dL (ref 3.5–5.0)
Anion gap: 11 (ref 5–15)
BUN: 28 mg/dL — AB (ref 6–20)
CALCIUM: 10.2 mg/dL (ref 8.9–10.3)
CHLORIDE: 102 mmol/L (ref 101–111)
CO2: 27 mmol/L (ref 22–32)
Creatinine, Ser: 1.24 mg/dL — ABNORMAL HIGH (ref 0.44–1.00)
GFR, EST AFRICAN AMERICAN: 42 mL/min — AB (ref >60)
GFR, EST NON AFRICAN AMERICAN: 36 mL/min — AB (ref >60)
Glucose, Bld: 109 mg/dL — ABNORMAL HIGH (ref 65–99)
Potassium: 4.1 mmol/L (ref 3.5–5.1)
Sodium: 140 mmol/L (ref 135–145)
Total Bilirubin: 0.6 mg/dL (ref 0.3–1.2)
Total Protein: 7.1 g/dL (ref 6.5–8.1)

## 2018-02-10 LAB — I-STAT TROPONIN, ED: TROPONIN I, POC: 0.03 ng/mL (ref 0.00–0.08)

## 2018-02-10 LAB — CBG MONITORING, ED: GLUCOSE-CAPILLARY: 113 mg/dL — AB (ref 65–99)

## 2018-02-10 LAB — I-STAT CHEM 8, ED
BUN: 29 mg/dL — ABNORMAL HIGH (ref 6–20)
CALCIUM ION: 1.19 mmol/L (ref 1.15–1.40)
CHLORIDE: 102 mmol/L (ref 101–111)
CREATININE: 1.2 mg/dL — AB (ref 0.44–1.00)
Glucose, Bld: 105 mg/dL — ABNORMAL HIGH (ref 65–99)
HCT: 40 % (ref 36.0–46.0)
Hemoglobin: 13.6 g/dL (ref 12.0–15.0)
POTASSIUM: 4.1 mmol/L (ref 3.5–5.1)
Sodium: 140 mmol/L (ref 135–145)
TCO2: 29 mmol/L (ref 22–32)

## 2018-02-10 LAB — APTT: aPTT: 32 seconds (ref 24–36)

## 2018-02-10 LAB — PROTIME-INR
INR: 0.97
PROTHROMBIN TIME: 12.8 s (ref 11.4–15.2)

## 2018-02-10 MED ORDER — CARBAMAZEPINE 100 MG PO CHEW: 100.0000 mg | CHEWABLE_TABLET | Freq: Three times a day (TID) | ORAL | Status: DC | PRN

## 2018-02-10 MED ORDER — LABETALOL HCL 5 MG/ML IV SOLN
0.5000 mg/min | INTRAVENOUS | Status: DC
  Filled 2018-02-10: qty 100

## 2018-02-10 MED ORDER — CLONIDINE HCL 0.2 MG PO TABS
0.2000 mg | ORAL_TABLET | Freq: Once | ORAL | Status: AC
  Administered 2018-02-10: 0.2 mg via ORAL
  Filled 2018-02-10: qty 1

## 2018-02-10 MED ORDER — MOMETASONE FURO-FORMOTEROL FUM 200-5 MCG/ACT IN AERO
2.0000 | INHALATION_SPRAY | Freq: Two times a day (BID) | RESPIRATORY_TRACT | Status: DC
  Administered 2018-02-11 – 2018-02-14 (×5): 2 via RESPIRATORY_TRACT
  Filled 2018-02-10: qty 8.8

## 2018-02-10 MED ORDER — SENNOSIDES-DOCUSATE SODIUM 8.6-50 MG PO TABS
1.0000 | ORAL_TABLET | Freq: Every evening | ORAL | Status: DC | PRN
  Filled 2018-02-10: qty 1

## 2018-02-10 MED ORDER — CLONAZEPAM 0.5 MG PO TABS
0.5000 mg | ORAL_TABLET | Freq: Every day | ORAL | Status: DC
  Administered 2018-02-11 – 2018-02-13 (×4): 0.5 mg via ORAL
  Filled 2018-02-10 (×4): qty 1

## 2018-02-10 MED ORDER — ENOXAPARIN SODIUM 30 MG/0.3ML ~~LOC~~ SOLN
30.0000 mg | SUBCUTANEOUS | Status: DC
  Administered 2018-02-11 – 2018-02-14 (×4): 30 mg via SUBCUTANEOUS
  Filled 2018-02-10 (×4): qty 0.3

## 2018-02-10 MED ORDER — CLONIDINE HCL 0.1 MG PO TABS
0.2000 mg | ORAL_TABLET | Freq: Every day | ORAL | Status: DC
  Administered 2018-02-11 – 2018-02-13 (×4): 0.2 mg via ORAL
  Filled 2018-02-10 (×4): qty 2

## 2018-02-10 MED ORDER — OLANZAPINE 2.5 MG PO TABS
2.5000 mg | ORAL_TABLET | Freq: Every day | ORAL | Status: DC
  Administered 2018-02-12 – 2018-02-13 (×2): 2.5 mg via ORAL
  Filled 2018-02-10 (×5): qty 1

## 2018-02-10 MED ORDER — ACETAMINOPHEN 325 MG PO TABS
650.0000 mg | ORAL_TABLET | ORAL | Status: DC | PRN
  Administered 2018-02-13: 650 mg via ORAL
  Filled 2018-02-10: qty 2

## 2018-02-10 MED ORDER — VITAMIN B-12 1000 MCG PO TABS
1000.0000 ug | ORAL_TABLET | Freq: Every day | ORAL | Status: DC
Start: 2018-02-11 — End: 2018-02-14
  Administered 2018-02-11 – 2018-02-14 (×4): 1000 ug via ORAL
  Filled 2018-02-10 (×4): qty 1

## 2018-02-10 MED ORDER — METOPROLOL SUCCINATE ER 25 MG PO TB24
25.0000 mg | ORAL_TABLET | Freq: Two times a day (BID) | ORAL | Status: DC
  Administered 2018-02-11 – 2018-02-14 (×8): 25 mg via ORAL
  Filled 2018-02-10 (×9): qty 1

## 2018-02-10 MED ORDER — VITAMIN E 180 MG (400 UNIT) PO CAPS
400.0000 [IU] | ORAL_CAPSULE | Freq: Every day | ORAL | Status: DC
  Administered 2018-02-11 – 2018-02-14 (×4): 400 [IU] via ORAL
  Filled 2018-02-10 (×4): qty 1

## 2018-02-10 MED ORDER — SODIUM CHLORIDE 0.9 % IV BOLUS
250.0000 mL | Freq: Once | INTRAVENOUS | Status: AC
  Administered 2018-02-10: 250 mL via INTRAVENOUS

## 2018-02-10 MED ORDER — VITAMIN C 500 MG PO TABS
500.0000 mg | ORAL_TABLET | Freq: Every day | ORAL | Status: DC
  Administered 2018-02-11 – 2018-02-14 (×4): 500 mg via ORAL
  Filled 2018-02-10 (×4): qty 1

## 2018-02-10 MED ORDER — LABETALOL HCL 5 MG/ML IV SOLN: 10.0000 mg | Freq: Once | INTRAVENOUS | Status: DC

## 2018-02-10 MED ORDER — HYDRALAZINE HCL 20 MG/ML IJ SOLN: 5.0000 mg | INTRAMUSCULAR | Status: DC | PRN

## 2018-02-10 MED ORDER — CLOPIDOGREL BISULFATE 75 MG PO TABS
75.0000 mg | ORAL_TABLET | Freq: Every day | ORAL | Status: DC
  Administered 2018-02-11 – 2018-02-14 (×4): 75 mg via ORAL
  Filled 2018-02-10 (×4): qty 1

## 2018-02-10 MED ORDER — STROKE: EARLY STAGES OF RECOVERY BOOK
Freq: Once | Status: AC
  Administered 2018-02-11
  Filled 2018-02-10: qty 1

## 2018-02-10 MED ORDER — ACETAMINOPHEN 160 MG/5ML PO SOLN: 650.0000 mg | ORAL | Status: DC | PRN

## 2018-02-10 MED ORDER — ALBUTEROL SULFATE (2.5 MG/3ML) 0.083% IN NEBU
2.5000 mg | INHALATION_SOLUTION | RESPIRATORY_TRACT | Status: DC | PRN
Start: 2018-02-10 — End: 2018-02-14

## 2018-02-10 MED ORDER — ASPIRIN EC 81 MG PO TBEC
81.0000 mg | DELAYED_RELEASE_TABLET | Freq: Every evening | ORAL | Status: DC
  Administered 2018-02-11 – 2018-02-14 (×5): 81 mg via ORAL
  Filled 2018-02-10 (×6): qty 1

## 2018-02-10 MED ORDER — THEOPHYLLINE ER 300 MG PO TB12
150.0000 mg | ORAL_TABLET | Freq: Every day | ORAL | Status: DC
Start: 2018-02-11 — End: 2018-02-14
  Administered 2018-02-11 – 2018-02-14 (×4): 150 mg via ORAL
  Filled 2018-02-10 (×4): qty 1

## 2018-02-10 MED ORDER — ACETAMINOPHEN 650 MG RE SUPP: 650.0000 mg | RECTAL | Status: DC | PRN

## 2018-02-10 MED ORDER — LORAZEPAM 2 MG/ML IJ SOLN
0.5000 mg | Freq: Once | INTRAMUSCULAR | Status: AC
  Administered 2018-02-10: 0.5 mg via INTRAVENOUS
  Filled 2018-02-10: qty 1

## 2018-02-10 MED ORDER — SODIUM CHLORIDE 0.9 % IV SOLN
INTRAVENOUS | Status: DC
  Administered 2018-02-11 – 2018-02-14 (×4): via INTRAVENOUS

## 2018-02-10 NOTE — ED Triage Notes (Signed)
Pt presents to ED with granddaughter with complaints of stroke like symptoms that were present yesterday morning when she woke up at 0700. Pt grand daughter endorses she is unable to walk on her own, confused, not eating, and has been forgetful.

## 2018-02-10 NOTE — ED Notes (Addendum)
Pt. returned from XR. 

## 2018-02-10 NOTE — ED Notes (Signed)
Pt put on Purewick.   

## 2018-02-10 NOTE — ED Notes (Signed)
Pt informed of delay for MRI, calling MRI to check on status

## 2018-02-10 NOTE — H&P (Signed)
History and Physical    Erin Cox UMP:[id number] DOB: 12-06-1920 DOA: 02/10/2018  Referring MD/NP/PA:   PCP: Erin Huddle, MD   Patient coming from:  The patient is coming from home.  At baseline, pt is partialy dependent for most of ADL.  Chief Complaint: AMS, slurred speech, gait instability  HPI: Erin Cox is a 84 y.o. female with medical history significant of hypertension, COPD, TIA, anxiety, coronary artery stenosis, CK-3, who presents with altered mental status, slurred speech, gait instability.  Per her granddaughter, patient was noted to be mildly confused yesterday morning at about 7 AM. She also has mild slurred speech and unsteady gait. Patient is forgetful than her baseline. No vision change or hearing loss. Patient moves all extremities normally, does not seem to have unilateral weakness in extremities per her granddaughter. Patient refused to come to the hospital yesterday. Patient does not have chest pain, shortness breath, cough, fever or chills. No nausea, vomiting, diarrhea or abdominal pain. Patient has increased urinary frequency, but no dysuria or burning on urination.  ED Course: pt was found to have WBC 6.2, INR 0.97, negative urinalysis, stable renal function, tachycardia, oxygen saturation 97% on room air, temperature normal, chest x-ray showed COPD. CT head is negative for acute intracranial abnormalities. MRI of brain showed pontine stroke. Patient is admitted to telemetry bed as inpatient. Neurology, Dr. Leonel Ramsay was consulted.  Review of Systems:   General: no fevers, chills, no body weight gain, has fatigue HEENT: no blurry vision, hearing changes or sore throat Respiratory: no dyspnea, coughing, wheezing CV: no chest pain, no palpitations GI: no nausea, vomiting, abdominal pain, diarrhea, constipation GU: no dysuria, burning on urination, has increased urinary frequency, no hematuria  Ext: no leg edema Neuro: has AMS, slurred speech, gait  instability Skin: no rash, no skin tear. MSK: No muscle spasm, no deformity, no limitation of range of movement in spin Heme: No easy bruising.  Travel history: No recent long distant travel.  Allergy:  Allergies  Allergen Reactions  . Lipitor [Atorvastatin] Other (See Comments)    Muscle contractions and fuzzy feeling  . Other Hives and Swelling    Reaction to walnuts  . Penicillins Hives    Has patient had a PCN reaction causing immediate rash, facial/tongue/throat swelling, SOB or lightheadedness with hypotension: Yes Has patient had a PCN reaction causing severe rash involving mucus membranes or skin necrosis: No Has patient had a PCN reaction that required hospitalization: No Has patient had a PCN reaction occurring within the last 10 years: Yes If all of the above answers are "NO", then may proceed with Cephalosporin use.  . Statins Other (See Comments)    Muscle contractions and fuzzy feeling  . Sulfa Antibiotics Other (See Comments)    Made asthma worse  . Iodine Rash    Past Medical History:  Diagnosis Date  . Asthma   . Carotid artery obstruction   . COPD (chronic obstructive pulmonary disease) ([place])   . Hypertension   . TIA (transient ischemic attack)     Past Surgical History:  Procedure Laterality Date  . APPENDECTOMY      Social History:  reports that she has never smoked. She has never used smokeless tobacco. She reports that she does not drink alcohol or use drugs.  Family History:  Family History  Problem Relation Age of Onset  . Hypertension Mother   . Heart attack Father      Prior to Admission medications   Medication Sig Start  Date End Date Taking? Authorizing Provider  Ascorbic Acid (VITAMIN C) 1000 MG tablet Take 500 mg by mouth daily.    Yes [provider]  aspirin EC 81 MG tablet Take 81 mg by mouth every evening.   Yes [provider]  budesonide-formoterol (SYMBICORT) 160-4.5 MCG/ACT inhaler Inhale 2 puffs into the  lungs 2 (two) times daily.   Yes [provider]  Calcium Citrate (CITRACAL PO) Take 2 tablets by mouth daily.   Yes [provider]  carbamazepine (TEGRETOL) 100 MG chewable tablet Chew 100 mg by mouth 3 (three) times daily as needed (for headaches).    Yes [provider]  clonazePAM (KLONOPIN) 0.5 MG tablet Take 0.5 mg by mouth at bedtime. For anxiety   Yes [provider]  cloNIDine (CATAPRES) 0.2 MG tablet Take 0.2 mg by mouth at bedtime.   Yes [provider]  clopidogrel (PLAVIX) 75 MG tablet Take 75 mg by mouth daily.   Yes [provider]  felodipine (PLENDIL) 10 MG 24 hr tablet Take 5 mg by mouth daily.   Yes [provider]  losartan (COZAAR) 100 MG tablet Take 100 mg by mouth daily.   Yes [provider]  metoprolol succinate (TOPROL-XL) 25 MG 24 hr tablet Take 25 mg by mouth 2 (two) times daily.   Yes [provider]  OLANZapine (ZYPREXA) 5 MG tablet Take 2.5 mg by mouth at bedtime.   Yes [provider]  potassium gluconate 595 MG TABS Take 595 mg by mouth daily.   Yes [provider]  theophylline (THEODUR) 300 MG 12 hr tablet Take 150 mg by mouth daily.   Yes [provider]  vitamin B-12 (CYANOCOBALAMIN) 1000 MCG tablet Take 1,000 mcg by mouth daily.   Yes [provider]  vitamin E 400 UNIT capsule Take 400 Units by mouth daily.   Yes [provider]    Physical Exam: Vitals:   02/10/18 1930 02/10/18 2115 02/10/18 2145 02/10/18 2200  BP: (!) 155/86 (!) 177/112 (!) 180/101 (!) 174/120  Pulse: 81 87 88 87  Resp: 17 17 20 19   Temp:      TempSrc:      SpO2: 97% 98% 98% 99%   General: Not in acute distress. Very thin body habitus HEENT:       Eyes: PERRL, EOMI, no scleral icterus.       ENT: No discharge from the ears and nose, no pharynx injection, no tonsillar enlargement.        Neck: No JVD, no bruit, no mass felt. Heme: No neck lymph node  enlargement. Cardiac: S1/S2, RRR, No murmurs, No gallops or rubs. Respiratory: No rales, wheezing, rhonchi or rubs. GI: Soft, nondistended, nontender, no rebound pain, no organomegaly, BS present. GU: No hematuria Ext: No pitting leg edema bilaterally. 2+DP/PT pulse bilaterally. Musculoskeletal: No joint deformities, No joint redness or warmth, no limitation of ROM in spin. Skin: No rashes.  Neuro: Alert, mildly connfused, but still oriented X3, cranial nerves II-XII grossly intact, Muscle strength 5/5 in all extremities, sensation to light touch intact. Brachial reflex 2+ bilaterally. Negative Babinski's sign. Psych: Patient is not psychotic, no suicidal or hemocidal ideation.  Labs on Admission: I have personally reviewed following labs and imaging studies  CBC: Recent Labs  Lab 02/10/18 1052 02/10/18 1105  WBC 6.2  --   NEUTROABS 4.5  --   HGB 12.9 13.6  HCT 40.8 40.0  MCV 95.8  --   PLT 233  --  Basic Metabolic Panel: Recent Labs  Lab 02/10/18 1052 02/10/18 1105  NA 140 140  K 4.1 4.1  CL 102 102  CO2 27  --   GLUCOSE 109* 105*  BUN 28* 29*  CREATININE 1.24* 1.20*  CALCIUM 10.2  --    GFR: CrCl cannot be calculated (Unknown ideal weight.). Liver Function Tests: Recent Labs  Lab 02/10/18 1052  AST 24  ALT 15  ALKPHOS 102  BILITOT 0.6  PROT 7.1  ALBUMIN 4.1   No results for input(s): LIPASE, AMYLASE in the last 168 hours. No results for input(s): AMMONIA in the last 168 hours. Coagulation Profile: Recent Labs  Lab 02/10/18 1052  INR 0.97   Cardiac Enzymes: No results for input(s): CKTOTAL, CKMB, CKMBINDEX, TROPONINI in the last 168 hours. BNP (last 3 results) No results for input(s): PROBNP in the last 8760 hours. HbA1C: No results for input(s): HGBA1C in the last 72 hours. CBG: Recent Labs  Lab 02/10/18 1042  GLUCAP 113*   Lipid Profile: No results for input(s): CHOL, HDL, LDLCALC, TRIG, CHOLHDL, LDLDIRECT in the last 72 hours. Thyroid  Function Tests: No results for input(s): TSH, T4TOTAL, FREET4, T3FREE, THYROIDAB in the last 72 hours. Anemia Panel: No results for input(s): VITAMINB12, FOLATE, FERRITIN, TIBC, IRON, RETICCTPCT in the last 72 hours. Urine analysis:    Component Value Date/Time   COLORURINE YELLOW 02/10/2018 [place] 02/10/2018 1219   LABSPEC 1.013 02/10/2018 1219   PHURINE 7.0 02/10/2018 1219   GLUCOSEU NEGATIVE 02/10/2018 1219   HGBUR NEGATIVE 02/10/2018 1219   BILIRUBINUR NEGATIVE 02/10/2018 1219   KETONESUR NEGATIVE 02/10/2018 1219   PROTEINUR 30 (A) 02/10/2018 1219   UROBILINOGEN 0.2 02/14/2012 0736   NITRITE NEGATIVE 02/10/2018 1219   LEUKOCYTESUR NEGATIVE 02/10/2018 1219   Sepsis Labs: @LABRCNTIP (procalcitonin:4,lacticidven:4) )No results found for this or any previous visit (from the past 240 hour(s)).   Radiological Exams on Admission: Dg Chest 2 View  Result Date: 02/10/2018 CLINICAL DATA:  COPD and wheezing.  Altered mental status. EXAM: CHEST - 2 VIEW COMPARISON:  Chest x-ray dated February 03, 2013. FINDINGS: The heart size and mediastinal contours are within normal limits. Normal pulmonary vascularity. The lungs remain hyperexpanded with emphysematous changes. Scarring in the right upper lobe. No focal consolidation, pleural effusion, or pneumothorax. Mild to moderate compression deformity of a midthoracic vertebral body is unchanged. Old healed fracture deformity of the left proximal humerus. No acute osseous abnormality. IMPRESSION: 1. COPD.  No active cardiopulmonary disease. Electronically Signed   By: Titus Dubin M.D.   On: 02/10/2018 13:49   Ct Head Wo Contrast  Result Date: 02/10/2018 CLINICAL DATA:  Confusion since yesterday. EXAM: CT HEAD WITHOUT CONTRAST TECHNIQUE: Contiguous axial images were obtained from the base of the skull through the vertex without intravenous contrast. COMPARISON:  11/05/2009 FINDINGS: Brain: Advanced generalized cerebral and cerebellar  atrophy. Extensive chronic small-vessel ischemic changes throughout the pons in the cerebral hemispheric white matter. Old small lacunar infarctions of the basal ganglia and thalami. No sign of acute infarction, mass lesion, hemorrhage, hydrocephalus or extra-axial collection. Vascular: There is atherosclerotic calcification of the major vessels at the base of the brain. Skull: Normal Sinuses/Orbits: Clear/normal Other: None IMPRESSION: No acute finding by CT. Advanced atrophy and chronic small-vessel ischemic changes throughout the brain, progressive since 2011. Electronically Signed   By: Nelson Chimes M.D.   On: 02/10/2018 11:35   Mr Brain Wo Contrast  Result Date: 02/10/2018 CLINICAL DATA:  Stroke-like symptoms, unable to  walk on her own, with confusion. Symptoms since yesterday. EXAM: MRI HEAD WITHOUT CONTRAST TECHNIQUE: Multiplanar, multiecho pulse sequences of the brain and surrounding structures were obtained without intravenous contrast. COMPARISON:  CT head earlier today FINDINGS: Brain: Moderate-sized acute pontine infarct, RIGHT paramedian location. Corresponding low ADC. No hemorrhage. Atrophy with hydrocephalus ex vacuo. Mild small vessel disease. No intra-axial mass lesion or extra-axial fluid. No midline shift. Vascular: Dolichoectasia, dolichoectasia of the intracranial circulation. RIGHT internal carotid, basilar, and both vertebrals widely patent. There is a chronic appearing occlusion of the LEFT ICA in its skull base segment. This appears to be reconstituted by collateral flow intracranially. Skull and upper cervical spine: Normal marrow signal. Sinuses/Orbits: BILATERAL cataract extraction. No significant sinus fluid. Other: None. IMPRESSION: Moderate-sized acute nonhemorrhagic pontine infarct. Wide patency of the adjacent basilar artery suggests small-vessel insult. Advanced atrophy. Chronic appearing occlusion LEFT ICA. Electronically Signed   By: Staci Righter M.D.   On: 02/10/2018 20:59       EKG: Independently reviewed. Sinus rhythm, QTC 452, LAD,anteroseptal infarction pattern, high T-wave in precordial leads   Assessment/Plan Principal Problem:   Stroke (cerebrum) (HCC) Active Problems:   Acute metabolic encephalopathy   CKD (chronic kidney disease), stage III (HCC)   COPD (chronic obstructive pulmonary disease) (HCC)   Anxiety   Protein-calorie malnutrition, moderate (HCC)   Essential hypertension   Stroke (cerebrum) ([place]): Moderate-sized acute nonhemorrhagic pontine infarct. Neurology, Dr. Leonel Ramsay was consulted.  - will admit to tele bed as inpt - will follow up Neurology's Recs.  - Obtain MRA  - will hold cozarr and felodipine to allow permissive HTN in the setting of acute stroke  - Check carotid dopplers  - Continue Plavix and add ASA  - pt is allergic to statin (lipitor) - fasting lipid panel and HbA1c  - 2D transthoracic echocardiography  - PT/OT consult - CM and SW consult  Acute metabolic encephalopathy: 2/2 to stroke. Not oriented x 3. -Frequent neuro check  HTN: - will hold coenzyme and felodipine to allow permissive HTN in the setting of acute stroke  -continue Clonidine to avoid rebound hypertension -Continue metoprolol due to tachycardia -IV hydralazine when necessary for SBP>220  CKD (chronic kidney disease), stage III ([place]): Stable. Baseline creatinine 1.18 on 12/06/12. Her creatinine is 1.20, BUN 29, close to the baseline. -f/u by BMP  COPD (chronic obstructive pulmonary disease) ([place]): stable -Dulera inhaler -prn albuterol nebs -continue theophylline  Anxiety: -continue home Clonidine  Protein-calorie malnutrition, moderate (HCC) -nutrition consult   DVT ppx: SQ Lovenox Code Status: DNR (I discussed with patient in the presence of her granddaughter, and explained the meaning of CODE STATUS. Patient wants to be DNR.) Family Communication:  Yes, patient's  granddaughter  at bed side Disposition Plan:  Anticipate  discharge back to previous home environment Consults called:  Dr. Leonel Ramsay of neurology Admission status:  Inpatient/tele     Date of Service 02/10/2018    [facility] Pager [phone]  If 7PM-7AM, please contact night-coverage [email or web address] Password [facility] 02/10/2018, 10:56 PM

## 2018-02-10 NOTE — ED Notes (Signed)
Patient transported to MRI 

## 2018-02-10 NOTE — ED Provider Notes (Signed)
[facility] DEPARTMENT Provider Note   CSN: [id number] Arrival date & time: 02/10/18  1024     History   Chief Complaint Chief Complaint  Patient presents with  . Altered Mental Status    HPI Erin Cox is a 84 y.o. female.  HPI Patient brought in by granddaughters for increased confusion noted yesterday.  She has had forgetfulness, unsteady gait and intermittent slurred speech.  Also states patient has had decreased appetite.  Increasing chills but no documented fever.  States they believe patient has been taking her blood pressure medication as prescribed.  Increased urinary frequency but patient denies any dysuria.  No cough, shortness of breath or chest pain.  No abdominal pain, nausea, vomiting or diarrhea. Past Medical History:  Diagnosis Date  . Asthma   . Carotid artery obstruction   . COPD (chronic obstructive pulmonary disease) ([place])   . Hypertension   . TIA (transient ischemic attack)     Patient Active Problem List   Diagnosis Date Noted  . Stroke (cerebrum) ([place]) 02/10/2018  . Acute metabolic encephalopathy 46/27/0350  . CKD (chronic kidney disease), stage III ([place]) 02/10/2018  . COPD (chronic obstructive pulmonary disease) ([place]) 02/10/2018  . Anxiety 02/10/2018  . TIA (transient ischemic attack)   . Carotid artery obstruction     Past Surgical History:  Procedure Laterality Date  . APPENDECTOMY       OB History   None      Home Medications    Prior to Admission medications   Medication Sig Start Date End Date Taking? Authorizing Provider  Ascorbic Acid (VITAMIN C) 1000 MG tablet Take 500 mg by mouth daily.    Yes [provider]  aspirin EC 81 MG tablet Take 81 mg by mouth every evening.   Yes [provider]  budesonide-formoterol (SYMBICORT) 160-4.5 MCG/ACT inhaler Inhale 2 puffs into the lungs 2 (two) times daily.   Yes [provider]  Calcium Citrate (CITRACAL PO) Take 2 tablets by  mouth daily.   Yes [provider]  carbamazepine (TEGRETOL) 100 MG chewable tablet Chew 100 mg by mouth 3 (three) times daily as needed (for headaches).    Yes [provider]  clonazePAM (KLONOPIN) 0.5 MG tablet Take 0.5 mg by mouth at bedtime. For anxiety   Yes [provider]  cloNIDine (CATAPRES) 0.2 MG tablet Take 0.2 mg by mouth at bedtime.   Yes [provider]  clopidogrel (PLAVIX) 75 MG tablet Take 75 mg by mouth daily.   Yes [provider]  felodipine (PLENDIL) 10 MG 24 hr tablet Take 5 mg by mouth daily.   Yes [provider]  losartan (COZAAR) 100 MG tablet Take 100 mg by mouth daily.   Yes [provider]  metoprolol succinate (TOPROL-XL) 25 MG 24 hr tablet Take 25 mg by mouth 2 (two) times daily.   Yes [provider]  OLANZapine (ZYPREXA) 5 MG tablet Take 2.5 mg by mouth at bedtime.   Yes [provider]  potassium gluconate 595 MG TABS Take 595 mg by mouth daily.   Yes [provider]  theophylline (THEODUR) 300 MG 12 hr tablet Take 150 mg by mouth daily.   Yes [provider]  vitamin B-12 (CYANOCOBALAMIN) 1000 MCG tablet Take 1,000 mcg by mouth daily.   Yes [provider]  vitamin E 400 UNIT capsule Take 400 Units by mouth daily.   Yes [provider]    Family History  No family history on file.  Social History Social History   Tobacco Use  . Smoking status: Never Smoker  . Smokeless tobacco: Never Used  Substance Use Topics  . Alcohol use: No  . Drug use: No     Allergies   Lipitor [atorvastatin]; Other; Penicillins; Statins; Sulfa antibiotics; and Iodine   Review of Systems Review of Systems  Constitutional: Positive for activity change, appetite change, chills and fatigue. Negative for fever.  HENT: Negative for sore throat and trouble swallowing.   Eyes: Negative for visual disturbance.  Respiratory: Negative for cough and shortness of  breath.   Cardiovascular: Negative for chest pain, palpitations and leg swelling.  Gastrointestinal: Negative for abdominal pain, constipation, diarrhea, nausea and vomiting.  Genitourinary: Positive for frequency. Negative for dysuria and hematuria.  Musculoskeletal: Positive for gait problem. Negative for arthralgias and myalgias.  Skin: Negative for rash and wound.  Neurological: Positive for speech difficulty. Negative for weakness, numbness and headaches.  Psychiatric/Behavioral: Positive for confusion.  All other systems reviewed and are negative.    Physical Exam Updated Vital Signs BP (!) 177/112   Pulse 87   Temp 98.6 F (37 C)   Resp 17   SpO2 98%   Physical Exam  Constitutional: She is oriented to person, place, and time. She appears well-developed and well-nourished. No distress.  HENT:  Head: Normocephalic and atraumatic.  Mouth/Throat: Oropharynx is clear and moist.  Flushed cheeks.  Oropharynx is clear.  Eyes: Pupils are equal, round, and reactive to light. EOM are normal.  Neck: Normal range of motion. Neck supple. No JVD present.  No meningismus  Cardiovascular: Regular rhythm.  Tachycardia.  Pulmonary/Chest: Effort normal. She has wheezes.  Diffuse expiratory wheezing.  Abdominal: Soft. Bowel sounds are normal. There is no tenderness. There is no rebound and no guarding.  Musculoskeletal: Normal range of motion. She exhibits no edema or tenderness.  2+ pulses in all extremities.  Lymphadenopathy:    She has no cervical adenopathy.  Neurological: She is alert and oriented to person, place, and time.  Patient is alert and oriented x3 with clear, goal oriented speech. Patient has 5/5 motor in all extremities. Sensation is intact to light touch. Bilateral finger-to-nose is normal with no signs of dysmetria.   Skin: Skin is warm and dry. Capillary refill takes less than 2 seconds. No rash noted. She is not diaphoretic. No erythema.  Psychiatric: She has a normal  mood and affect. Her behavior is normal.  Nursing note and vitals reviewed.    ED Treatments / Results  Labs (all labs ordered are listed, but only abnormal results are displayed) Labs Reviewed  COMPREHENSIVE METABOLIC PANEL - Abnormal; Notable for the following components:      Result Value   Glucose, Bld 109 (*)    BUN 28 (*)    Creatinine, Ser 1.24 (*)    GFR calc non Af Amer 36 (*)    GFR calc Af Amer 42 (*)    All other components within normal limits  URINALYSIS, ROUTINE W REFLEX MICROSCOPIC - Abnormal; Notable for the following components:   Protein, ur 30 (*)    Squamous Epithelial / LPF 0-5 (*)    All other components within normal limits  CBG MONITORING, ED - Abnormal; Notable for the following components:   Glucose-Capillary 113 (*)    All other components within normal limits  I-STAT CHEM 8, ED - Abnormal; Notable for the following components:   BUN 29 (*)  Creatinine, Ser 1.20 (*)    Glucose, Bld 105 (*)    All other components within normal limits  PROTIME-INR  APTT  CBC  DIFFERENTIAL  I-STAT TROPONIN, ED  CBG MONITORING, ED    EKG EKG Interpretation  Date/Time:  Thursday February 10 2018 10:33:36 EDT Ventricular Rate:  92 PR Interval:  174 QRS Duration: 86 QT Interval:  366 QTC Calculation: 452 R Axis:   -76 Text Interpretation:  Normal sinus rhythm with sinus arrhythmia Biatrial enlargement Left anterior fascicular block Cannot rule out Anterior infarct , age undetermined Abnormal ECG Confirmed by Julianne Rice [phone]) on 02/10/2018 12:19:51 PM   Radiology Dg Chest 2 View  Result Date: 02/10/2018 CLINICAL DATA:  COPD and wheezing.  Altered mental status. EXAM: CHEST - 2 VIEW COMPARISON:  Chest x-ray dated February 03, 2013. FINDINGS: The heart size and mediastinal contours are within normal limits. Normal pulmonary vascularity. The lungs remain hyperexpanded with emphysematous changes. Scarring in the right upper lobe. No focal consolidation, pleural  effusion, or pneumothorax. Mild to moderate compression deformity of a midthoracic vertebral body is unchanged. Old healed fracture deformity of the left proximal humerus. No acute osseous abnormality. IMPRESSION: 1. COPD.  No active cardiopulmonary disease. Electronically Signed   By: Titus Dubin M.D.   On: 02/10/2018 13:49   Ct Head Wo Contrast  Result Date: 02/10/2018 CLINICAL DATA:  Confusion since yesterday. EXAM: CT HEAD WITHOUT CONTRAST TECHNIQUE: Contiguous axial images were obtained from the base of the skull through the vertex without intravenous contrast. COMPARISON:  11/05/2009 FINDINGS: Brain: Advanced generalized cerebral and cerebellar atrophy. Extensive chronic small-vessel ischemic changes throughout the pons in the cerebral hemispheric white matter. Old small lacunar infarctions of the basal ganglia and thalami. No sign of acute infarction, mass lesion, hemorrhage, hydrocephalus or extra-axial collection. Vascular: There is atherosclerotic calcification of the major vessels at the base of the brain. Skull: Normal Sinuses/Orbits: Clear/normal Other: None IMPRESSION: No acute finding by CT. Advanced atrophy and chronic small-vessel ischemic changes throughout the brain, progressive since 2011. Electronically Signed   By: Nelson Chimes M.D.   On: 02/10/2018 11:35   Mr Brain Wo Contrast  Result Date: 02/10/2018 CLINICAL DATA:  Stroke-like symptoms, unable to walk on her own, with confusion. Symptoms since yesterday. EXAM: MRI HEAD WITHOUT CONTRAST TECHNIQUE: Multiplanar, multiecho pulse sequences of the brain and surrounding structures were obtained without intravenous contrast. COMPARISON:  CT head earlier today FINDINGS: Brain: Moderate-sized acute pontine infarct, RIGHT paramedian location. Corresponding low ADC. No hemorrhage. Atrophy with hydrocephalus ex vacuo. Mild small vessel disease. No intra-axial mass lesion or extra-axial fluid. No midline shift. Vascular: Dolichoectasia,  dolichoectasia of the intracranial circulation. RIGHT internal carotid, basilar, and both vertebrals widely patent. There is a chronic appearing occlusion of the LEFT ICA in its skull base segment. This appears to be reconstituted by collateral flow intracranially. Skull and upper cervical spine: Normal marrow signal. Sinuses/Orbits: BILATERAL cataract extraction. No significant sinus fluid. Other: None. IMPRESSION: Moderate-sized acute nonhemorrhagic pontine infarct. Wide patency of the adjacent basilar artery suggests small-vessel insult. Advanced atrophy. Chronic appearing occlusion LEFT ICA. Electronically Signed   By: Staci Righter M.D.   On: 02/10/2018 20:59    Procedures Procedures (including critical care time)  Medications Ordered in ED Medications  sodium chloride 0.9 % bolus 250 mL (0 mLs Intravenous Stopped 02/10/18 1402)  cloNIDine (CATAPRES) tablet 0.2 mg (0.2 mg Oral Given 02/10/18 1324)  LORazepam (ATIVAN) injection 0.5 mg (0.5 mg Intravenous Given 02/10/18 1542)  Initial Impression / Assessment and Plan / ED Course  I have reviewed the triage vital signs and the nursing notes.  Pertinent labs & imaging results that were available during my care of the patient were reviewed by me and considered in my medical decision making (see chart for details).     Blood pressure improved with Ativan and dose of clonidine.  CT head without acute findings.  MRI with evidence of acute pontine stroke.  Discussed with neurology, Dr. Leonel Ramsay who will see patient in the emergency department.  Hospitalist to admit.  Final Clinical Impressions(s) / ED Diagnoses   Final diagnoses:  Ischemic stroke [facility])    ED Discharge Orders    None       Julianne Rice, MD 02/10/18 2123

## 2018-02-10 NOTE — ED Notes (Signed)
ED Provider at bedside. 

## 2018-02-10 NOTE — ED Notes (Signed)
Pt back in room from MRI.

## 2018-02-10 NOTE — ED Notes (Signed)
Called MRI again, pt going in the next hour

## 2018-02-11 ENCOUNTER — Inpatient Hospital Stay (HOSPITAL_COMMUNITY): Payer: Medicare Other

## 2018-02-11 DIAGNOSIS — E43 Unspecified severe protein-calorie malnutrition: Secondary | ICD-10-CM

## 2018-02-11 DIAGNOSIS — I361 Nonrheumatic tricuspid (valve) insufficiency: Secondary | ICD-10-CM

## 2018-02-11 DIAGNOSIS — I6300 Cerebral infarction due to thrombosis of unspecified precerebral artery: Secondary | ICD-10-CM

## 2018-02-11 DIAGNOSIS — I10 Essential (primary) hypertension: Secondary | ICD-10-CM

## 2018-02-11 DIAGNOSIS — I639 Cerebral infarction, unspecified: Principal | ICD-10-CM

## 2018-02-11 DIAGNOSIS — J449 Chronic obstructive pulmonary disease, unspecified: Secondary | ICD-10-CM

## 2018-02-11 LAB — LIPID PANEL
CHOL/HDL RATIO: 2.1 ratio
Cholesterol: 218 mg/dL — ABNORMAL HIGH (ref 0–200)
HDL: 102 mg/dL (ref >40)
LDL CALC: 96 mg/dL (ref 0–99)
Triglycerides: 101 mg/dL (ref <150)
VLDL: 20 mg/dL (ref 0–40)

## 2018-02-11 LAB — ECHOCARDIOGRAM COMPLETE
HEIGHTINCHES: 50 in
WEIGHTICAEL: 1365.09 [oz_av]

## 2018-02-11 LAB — HEMOGLOBIN A1C
HEMOGLOBIN A1C: 5.2 % (ref 4.8–5.6)
MEAN PLASMA GLUCOSE: 102.54 mg/dL

## 2018-02-11 MED ORDER — ENSURE ENLIVE PO LIQD
237.0000 mL | Freq: Two times a day (BID) | ORAL | Status: DC
  Administered 2018-02-11 – 2018-02-13 (×4): 237 mL via ORAL

## 2018-02-11 MED ORDER — ROSUVASTATIN CALCIUM 5 MG PO TABS
5.0000 mg | ORAL_TABLET | Freq: Every day | ORAL | Status: DC
  Administered 2018-02-13 – 2018-02-14 (×2): 5 mg via ORAL
  Filled 2018-02-11 (×3): qty 1

## 2018-02-11 NOTE — Progress Notes (Signed)
Rehab Admissions Coordinator Note:  Patient was screened by Cleatrice Burke for appropriateness for an Inpatient Acute Rehab Consult per PT and OT recommendations. At this time, we are recommending Inpatient Rehab consult. Please place an order for consult if pt would like to be considered for admit.  Cleatrice Burke 02/11/2018, 7:20 PM  I can be reached at [phone].

## 2018-02-11 NOTE — Progress Notes (Signed)
STROKE TEAM PROGRESS NOTE   HISTORY OF PRESENT ILLNESS (per record) Erin Cox is a 84 y.o. female with a history of hypertension, TIA who presents with 2 days of unsteady gait.  She has been slurring her speech as well.  She denies any symptoms, but when her granddaughter corrects her, she defers to her.  She has a history of memory problems. She has no current complaints other than wanting to go home.   LKW: Wednesday morning, 7 AM tpa given?: no, outside of window     SUBJECTIVE (INTERVAL HISTORY) Her RN is at the bedside.  She states her speech is improving though she is not back to her baseline yet. She denies any prior history of strokes. She admits she has some memory difficulties but manages most of her affairs. She wants to go home.    OBJECTIVE Temp:  [97.5 F (36.4 C)-98.6 F (37 C)] 97.7 F (36.5 C) (04/19 0500) Pulse Rate:  [77-134] 79 (04/19 0500) Resp:  [14-28] 14 (04/19 0500) BP: (100-215)/(58-171) 129/58 (04/19 0500) SpO2:  [96 %-100 %] 99 % (04/19 0500) Weight:  [85 lb 5.1 oz (38.7 kg)] 85 lb 5.1 oz (38.7 kg) (04/18 2357)  CBC:  Recent Labs  Lab 02/10/18 1052 02/10/18 1105  WBC 6.2  --   NEUTROABS 4.5  --   HGB 12.9 13.6  HCT 40.8 40.0  MCV 95.8  --   PLT 233  --     Basic Metabolic Panel:  Recent Labs  Lab 02/10/18 1052 02/10/18 1105  NA 140 140  K 4.1 4.1  CL 102 102  CO2 27  --   GLUCOSE 109* 105*  BUN 28* 29*  CREATININE 1.24* 1.20*  CALCIUM 10.2  --     Lipid Panel:     Component Value Date/Time   CHOL 218 (H) 02/11/2018 0655   TRIG 101 02/11/2018 0655   HDL 102 02/11/2018 0655   CHOLHDL 2.1 02/11/2018 0655   VLDL 20 02/11/2018 0655   LDLCALC 96 02/11/2018 0655   HgbA1c:  Lab Results  Component Value Date   HGBA1C 5.2 02/11/2018   Urine Drug Screen: No results found for: LABOPIA, COCAINSCRNUR, LABBENZ, AMPHETMU, THCU, LABBARB  Alcohol Level No results found for: [facility]   IMAGING  Dg Chest 2 View   02/10/2018 IMPRESSION:  1. COPD.  No active cardiopulmonary disease.    Ct Head Wo Contrast 02/10/2018 IMPRESSION:  No acute finding by CT. Advanced atrophy and chronic small-vessel ischemic changes throughout the brain, progressive since 2011.    Mr Brain Wo Contrast 02/10/2018 IMPRESSION:  Moderate-sized acute nonhemorrhagic pontine infarct. Wide patency of the adjacent basilar artery suggests small-vessel insult. Advanced atrophy. Chronic appearing occlusion LEFT ICA.    Mr Jodene Nam Head Wo Contrast MRA Neck 02/11/2018 IMPRESSION:  1. Occluded left vertebral artery with reconstitution of the distal vertebral artery proximal to the vertebrobasilar junction.  2. Dominant right vertebral artery without focal stenosis.  3. Left internal carotid artery is occluded at the bifurcation with patent anterior communicating artery and normal appearance of the A1 and M1 segments bilaterally.  4. Moderate diffuse medium and small vessel disease.    Transthoracic Echocardiogram - pending 00/00/00    PHYSICAL EXAM Vitals:   02/11/18 0049 02/11/18 0144 02/11/18 0304 02/11/18 0500  BP: (!) 198/114 (!) 181/96 100/61 (!) 129/58  Pulse: 92 92 78 79  Resp:  16 16 14   Temp:  98.3 F (36.8 C) (!) 97.5 F (36.4 C) 97.7 F (  36.5 C)  TempSrc:  Oral Oral Oral  SpO2:  96% 98% 99%  Weight:      Height:       Pleasant elderly lady currently not in distress. . Afebrile. Head is nontraumatic. Neck is supple without bruit.    Cardiac exam no murmur or gallop. Lungs are clear to auscultation. Distal pulses are well felt.  Neurological exam :  Awake alert oriented x 3. Diminished attention, registration and recall .Mild dysarthricl speech . Mild left lower face asymmetry. Tongue midline. No drift. Mild diminished fine finger movements on left. Orbits right over left upper extremity. Mild left grip weak.. Normal sensation . Normal coordination.   ASSESSMENT/PLAN [name] is a 84 y.o. female  with history of carotid artery obstruction, asthma, COPD, hypertension, memory problems and previous TIA presenting with unsteady gait and slurred speech. She did not receive IV t-PA due to late presentation.  Stroke: pontine infarct - small vessel disease.  Resultant   Dysarthria and left facial weakness.  CT head - No acute finding by CT.  MRI head - Moderate-sized acute nonhemorrhagic pontine infarct.  MRA head -  Left internal carotid artery is occluded. Occluded left vertebral artery.  Carotid Doppler - MRA neck  2D Echo - pending  LDL - 96  HgbA1c - 5.2  VTE prophylaxis - Lovenox Fall precautions Diet Heart Room service appropriate? Yes; Fluid consistency: Thin  aspirin 81 mg daily and clopidogrel 75 mg daily prior to admission, now on aspirin 81 mg daily and clopidogrel 75 mg daily  Patient counseled to be compliant with her antithrombotic medications  Ongoing aggressive stroke risk factor management  Therapy recommendations:  pending  Disposition:  Pending  Hypertension  Blood pressure running high at times . Permissive hypertension (OK if < 220/120) but gradually normalize in 5-7 days . Long-term BP goal normotensive  Hyperlipidemia  Lipid lowering medication PTA:  none  LDL 96, goal < 70  Statin allergy  Current lipid lowering medication: none  Continue statin at discharge   Other Stroke Risk Factors  Advanced age  Hx stroke/TIA   Other Active Problems  BUN 29  ; creatinine 1.20   Plan / Recommendations   Stroke workup: awaiting - 2-D echo  Therapy Follow Up: pending  Disposition: pending  Antiplatelet / Anticoagulation: on dual antiplatelet therapy. Consider aspirin or Plavix alone.  Statin: statin allergy  MD Follow Up: pending  Other: pending  Further risk factor modification per primary care MD: Follow Up 2 weeks   Hospital day # 1  I have personally examined this patient, reviewed notes, independently viewed imaging  studies, participated in medical decision making and plan of care.ROS completed by me personally and pertinent positives fully documented  I have made any additions or clarifications directly to the above note. Agree with note above.  She presented with dysarthria and slurred speech due to pontine infarct from small vessel disease. Recommend dual antiplatelet therapy and aggressive risk factor modification. Continue ongoing stroke workup. Long discussion the patient with regarding her stroke and answered questions. Discussed with Dr. Ree Kida. Greater than 50% time during this 35 minute visit was spent on counseling and coordination of care about her stroke and discussion about plan of care and answered questions.  Antony Contras, MD Medical Director [facility] Pager: [phone] 02/11/2018 2:13 PM   To contact Stroke Continuity provider, please refer to [email or web address] After hours, contact General Neurology

## 2018-02-11 NOTE — Evaluation (Addendum)
Physical Therapy Evaluation Patient Details Name: Erin Cox MRN: [id number] DOB: 1921/07/10 Today's Date: 02/11/2018   History of Present Illness  Erin Cox is a 84 y.o. female with medical history significant of hypertension, COPD, TIA, anxiety, coronary artery stenosis, CK-3, who presents with altered mental status, slurred speech, gait instability. MRI revealed acute infarct.  Clinical Impression  Orders received for PT evaluation. Patient demonstrates deficits in functional mobility as indicated below. Will benefit from continued skilled PT to address deficits and maximize function. Will see as indicated and progress as tolerated.  At this time, patient does require multi modal cues and hands on assist to maintain safety. Very high fall risk. Will need post acute rehabiliation upon acute discharge.    Follow Up Recommendations CIR vs SNF pending ability to tolerate extensive therapies    Equipment Recommendations  (TBD)    Recommendations for Other Services Rehab consult     Precautions / Restrictions Precautions Precautions: Fall Precaution Comments: L visual impairments, hx L sided weakness and decreased AROM from kidney CA per pt's daughter Restrictions Weight Bearing Restrictions: No      Mobility  Bed Mobility Overal bed mobility: Needs Assistance Bed Mobility: Supine to Sit     Supine to sit: Min guard        Transfers Overall transfer level: Needs assistance Equipment used: Rolling walker (2 wheeled);Straight cane;2 person hand held assist Transfers: Sit to/from [facility] Sit to Stand: Min assist Stand pivot transfers: Min assist       General transfer comment: min A sit - stand from bed to perform bathing tasks with mod A for support.  Ambulation/Gait Ambulation/Gait assistance: Min assist Ambulation Distance (Feet): 18 Feet Assistive device: 1 person hand held assist Gait Pattern/deviations: Step-to pattern;Decreased stride  length;Shuffle;Trunk flexed;Narrow base of support Gait velocity: Patient very slow and guard Gait velocity interpretation: <1.31 ft/sec, indicative of household ambulator General Gait Details: slow guarded and unsteady with ambulation, posterior bias initially and then difficulty maintaining balance when turning toward chair. Multi modal cues for safety  Stairs            Wheelchair Mobility    Modified Rankin (Stroke Patients Only) Modified Rankin (Stroke Patients Only) Pre-Morbid Rankin Score: No symptoms Modified Rankin: Moderate disability     Balance Overall balance assessment: Needs assistance Sitting-balance support: No upper extremity supported;Feet supported Sitting balance-Leahy Scale: Fair   Postural control: Posterior lean Standing balance support: Single extremity supported;Bilateral upper extremity supported;During functional activity Standing balance-Leahy Scale: Poor                               Pertinent Vitals/Pain Pain Assessment: Faces Faces Pain Scale: No hurt    Home Living Family/patient expects to be discharged to:: Private residence Living Arrangements: Alone;Other relatives Available Help at Discharge: Family;Available PRN/intermittently Type of Home: House Home Access: Stairs to enter   Entrance Stairs-Number of Steps: 0 Home Layout: One level Home Equipment: Adaptive equipment      Prior Function Level of Independence: Independent with assistive device(s);Needs assistance   Gait / Transfers Assistance Needed: uses no AD for mobility, bu states that she reaches out for wall and furniture  ADL's / Homemaking Assistance Needed: pt was independent with ADLs, has Meals on Wheels  Comments: pt reports that family lives nearby     Hand Dominance   Dominant Hand: Right    Extremity/Trunk Assessment   Upper Extremity Assessment  Upper Extremity Assessment: Generalized weakness LUE Deficits / Details: pt underweight,  generalized weakness    Lower Extremity Assessment Lower Extremity Assessment: Generalized weakness    Cervical / Trunk Assessment Cervical / Trunk Assessment: Kyphotic  Communication   Communication: No difficulties  Cognition Arousal/Alertness: Awake/alert Behavior During Therapy: Impulsive Overall Cognitive Status: Impaired/Different from baseline Area of Impairment: Attention;Safety/judgement;Problem solving                         Safety/Judgement: Decreased awareness of safety;Decreased awareness of deficits   Problem Solving: Difficulty sequencing;Requires verbal cues;Requires tactile cues        General Comments      Exercises     Assessment/Plan    PT Assessment Patient needs continued PT services  PT Problem List Decreased strength;Decreased activity tolerance;Decreased balance;Decreased mobility;Decreased coordination;Decreased safety awareness       PT Treatment Interventions DME instruction;Gait training;Functional mobility training;Therapeutic activities;Therapeutic exercise;Balance training;Neuromuscular re-education;Cognitive remediation;Patient/family education    PT Goals (Current goals can be found in the Care Plan section)  Acute Rehab PT Goals Patient Stated Goal: get better PT Goal Formulation: With patient Time For Goal Achievement: 02/25/18 Potential to Achieve Goals: Good    Frequency Min 3X/week   Barriers to discharge        Co-evaluation PT/OT/SLP Co-Evaluation/Treatment: Yes Reason for Co-Treatment: For patient/therapist safety PT goals addressed during session: Mobility/safety with mobility OT goals addressed during session: ADL's and self-care       AM-PAC PT "6 Clicks" Daily Activity  Outcome Measure Difficulty turning over in bed (including adjusting bedclothes, sheets and blankets)?: Unable Difficulty moving from lying on back to sitting on the side of the bed? : Unable Difficulty sitting down on and standing  up from a chair with arms (e.g., wheelchair, bedside commode, etc,.)?: Unable Help needed moving to and from a bed to chair (including a wheelchair)?: A Lot Help needed walking in hospital room?: A Little Help needed climbing 3-5 steps with a railing? : A Lot 6 Click Score: 10    End of Session Equipment Utilized During Treatment: Gait belt Activity Tolerance: Patient tolerated treatment well Patient left: in chair;with call bell/phone within reach;with chair alarm set Nurse Communication: Mobility status PT Visit Diagnosis: Unsteadiness on feet (R26.81);Difficulty in walking, not elsewhere classified (R26.2)    Time: 5009-3818 PT Time Calculation (min) (ACUTE ONLY): 20 min   Charges:   PT Evaluation $PT Eval Moderate Complexity: 1 Mod     PT G Codes:        Alben Deeds, PT DPT  Board Certified Neurologic Specialist [place] 02/11/2018, 4:47 PM

## 2018-02-11 NOTE — Consult Note (Signed)
Neurology Consultation Reason for Consult: Unsteadiness Referring Physician: Mora Bellman   CC: Unsteady gait  History is obtained from: Daughter  HPI: Erin Cox is a 84 y.o. female with a history of hypertension, TIA who presents with 2 days of unsteady gait.  She has been slurring her speech as well.  She denies any symptoms, but when her granddaughter corrects her, she defers to her.  She has a history of memory problems.  She has no current complaints other than wanting to go home.   LKW: Wednesday morning, 7 AM tpa given?: no, outside of window   ROS: A 14 point ROS was performed and is negative except as noted in the HPI.   Past Medical History:  Diagnosis Date  . Asthma   . Carotid artery obstruction   . COPD (chronic obstructive pulmonary disease) ([place])   . Hypertension   . TIA (transient ischemic attack)      Family History  Problem Relation Age of Onset  . Hypertension Mother   . Heart attack Father      Social History:  reports that she has never smoked. She has never used smokeless tobacco. She reports that she does not drink alcohol or use drugs.   Exam: Current vital signs: BP (!) 181/96 (BP Location: Left Arm)   Pulse 92   Temp 98.3 F (36.8 C) (Oral)   Resp 16   Ht 4\' 2"  (1.27 m)   Wt 38.7 kg (85 lb 5.1 oz)   SpO2 96%   BMI 23.99 kg/m  Vital signs in last 24 hours: Temp:  [98.3 F (36.8 C)-98.6 F (37 C)] 98.3 F (36.8 C) (04/19 0144) Pulse Rate:  [77-134] 92 (04/19 0144) Resp:  [14-28] 16 (04/19 0144) BP: (112-215)/(79-171) 181/96 (04/19 0144) SpO2:  [96 %-100 %] 96 % (04/19 0144) Weight:  [38.7 kg (85 lb 5.1 oz)] 38.7 kg (85 lb 5.1 oz) (04/18 2357)   Physical Exam  Constitutional: Appears well-developed and well-nourished.  Psych: Affect appropriate to situation Eyes: No scleral injection HENT: No OP obstrucion Head: Normocephalic.  Cardiovascular: Normal rate and regular rhythm.  Respiratory: Effort normal, non-labored  breathing GI: Soft.  No distension. There is no tenderness.  Skin: WDI  Neuro: Mental Status: Patient is awake, alert, oriented to person,  month,  But gives years 1979  patient is able to give a clear and coherent history. No signs of aphasia or neglect Cranial Nerves: II: Visual Fields are full. Pupils are equal, round, and reactive to light.   III,IV, VI: EOMI without ptosis or diploplia.  V: Facial sensation is symmetric to temperature VII: Facial movement is notable for mild decreased nasolabial fold on the left, mild dysarthria VIII: hearing is intact to voice X: Uvula elevates symmetrically XI: Shoulder shrug is symmetric. XII: tongue is midline without atrophy or fasciculations.  Motor: Tone is normal. Bulk is normal. 5/5 strength was present in all four extremities.  Sensory: Sensation is symmetric to light touch and temperature in the arms and legs. Cerebellar: She has some difficulty with heel-knee-shin on the right.  Finger-nose-finger appears relatively symmetric with mild intentional tremor.     I have reviewed labs in epic and the results pertinent to this consultation are: CMP-borderline creatinine at 1.24  I have reviewed the images obtained: MRI brain-pontine infarct  Impression: 84 year old female with likely small vessel disease ischemic infarct.  Her clinical exam is much better than I would typically expect given the size of the pontine infarct.  She has been admitted for physical therapy and further evaluation.  Recommendations: 1. HgbA1c, fasting lipid panel 2. MRA  of the brain without contrast, MRA neck 3. Frequent neuro checks 4. Echocardiogram 5. Carotid dopplers are not needed given that it is a posterior circulation stroke 6. Prophylactic therapy-continue dual antiplatelet therapy 7. Risk factor modification 8. Telemetry monitoring 9. PT consult, OT consult, Speech consult 10. please page stroke NP  Or  PA  Or MD  from 8am -4 pm as this  patient will be followed by the stroke team at this point.   You can look them up on [email or web address]      Roland Rack, MD [facility] [phone]  If 7pm- 7am, please page neurology on call as listed in [place].

## 2018-02-11 NOTE — Progress Notes (Signed)
Pt states that she is unable to take Statin medications because it causes low levels of potassium.

## 2018-02-11 NOTE — Progress Notes (Signed)
PROGRESS NOTE    Erin Cox  UEA:[id number] DOB: 10-Jan-1921 DOA: 02/10/2018 PCP: [name], MD   Brief Narrative:  HPI On 02/10/2018 by Dr. Arie Sabina Dattilo is a 84 y.o. female with medical history significant of hypertension, COPD, TIA, anxiety, coronary artery stenosis, CK-3, who presents with altered mental status, slurred speech, gait instability.  Per her granddaughter, patient was noted to be mildly confused yesterday morning at about 7 AM. She also has mild slurred speech and unsteady gait. Patient is forgetful than her baseline. No vision change or hearing loss. Patient moves all extremities normally, does not seem to have unilateral weakness in extremities per her granddaughter. Patient refused to come to the hospital yesterday. Patient does not have chest pain, shortness breath, cough, fever or chills. No nausea, vomiting, diarrhea or abdominal pain. Patient has increased urinary frequency, but no dysuria or burning on urination.   Assessment & Plan   Acute CVA -Patient presented with unsteady gait as well as slurring speech -CT head showed no acute finding -MRI brain showed moderate sized acute nonhemorrhagic pontine infarct.  Chronic appearing occlusion left ICA -MRA neck and head: Occluded left vertebral artery with reconstitution of the distal vertebral artery proximal to the vertebrobasilar junction.  Left ICA is occluded -Echocardiogram: EF of 47-82%, grade 1 diastolic dysfunction -LDL 96, hemoglobin A1c 5.2 -PT and OT evaluations pending -Currently on aspirin, Plavix -Patient has a statin intolerance -Neurology consulted and appreciated.  Discussed with Dr. Leonie Man, recommended starting Crestor 5 mg daily along with co-Q10 200 mg daily  Acute metabolic encephalopathy -possibly has underlying dementia vs secondary to CVA -Currently stable -Continue to monitor  Essential hypertension -Clonidine and metoprolol continued -Plendil, Cozaar held due to CVA,  allowing for permissive hypertension  Hyperlipidemia -Panel shows a total cholesterol 218, HDL 102, LDL 96, triglycerides 101 -Patient has had a statin intolerance in the past.  Neurology recommended starting Crestor 5 mg daily along with co-Q10  Chronic kidney disease, Stage III -Stable, continue to monitor BMP  COPD -Stable, continue Dulera, theophylline nebs  Anxiety  -Continue clonidine   Severe protein calorie malnutrition -nutrition consult , continue feeding supplements  DVT Prophylaxis  Lovenox  Code Status: DNR  Family Communication: Family at bedside  Disposition Plan: Admitted, pending PT/OT and CVA workup. Suspect SNF at discharge  Consultants Neurology  Procedures  Echocardiogram  Antibiotics   Anti-infectives (From admission, onward)   None      Subjective:   Erin Cox seen and examined today.  Denies having slurred speech.  Denies feeling weak.  Currently denies chest pain, shortness breath, abdominal pain, nausea vomiting, diarrhea or constipation, dizziness or headache.  Family at bedside states that she is unable to take care of herself, lives at home alone.  Objective:   Vitals:   02/11/18 0144 02/11/18 0304 02/11/18 0500 02/11/18 1105  BP: (!) 181/96 100/61 (!) 129/58 108/79  Pulse: 92 78 79   Resp: 16 16 14 16   Temp: 98.3 F (36.8 C) (!) 97.5 F (36.4 C) 97.7 F (36.5 C) 98.2 F (36.8 C)  TempSrc: Oral Oral Oral Oral  SpO2: 96% 98% 99% 99%  Weight:      Height:        Intake/Output Summary (Last 24 hours) at 02/11/2018 1421 Last data filed at 02/11/2018 1310 Gross per 24 hour  Intake 360 ml  Output 200 ml  Net 160 ml   Filed Weights   02/10/18 2357  Weight: 38.7 kg (85  lb 5.1 oz)    Exam  General: Well developed, orally, thin, no apparent distress  HEENT: NCAT, mucous membranes moist.   Neck: Supple  Cardiovascular: S1 S2 auscultated, Regular rate and rhythm.  Respiratory: Clear to auscultation bilaterally with  equal chest rise  Abdomen: Soft, nontender, nondistended, + bowel sounds  Extremities: warm dry without cyanosis clubbing or edema  Neuro: AAOx2 (person, month) , cranial nerves grossly intact.  Strength 5 out of 5 in all 4 extremities.  Mild intentional tremor  Skin: Ecchymosis  Psych: appropriate mood and affect, poor judgment and insight   Data Reviewed: I have personally reviewed following labs and imaging studies  CBC: Recent Labs  Lab 02/10/18 1052 02/10/18 1105  WBC 6.2  --   NEUTROABS 4.5  --   HGB 12.9 13.6  HCT 40.8 40.0  MCV 95.8  --   PLT 233  --    Basic Metabolic Panel: Recent Labs  Lab 02/10/18 1052 02/10/18 1105  NA 140 140  K 4.1 4.1  CL 102 102  CO2 27  --   GLUCOSE 109* 105*  BUN 28* 29*  CREATININE 1.24* 1.20*  CALCIUM 10.2  --    GFR: Estimated Creatinine Clearance: 13.1 mL/min (A) (by C-G formula based on SCr of 1.2 mg/dL (H)). Liver Function Tests: Recent Labs  Lab 02/10/18 1052  AST 24  ALT 15  ALKPHOS 102  BILITOT 0.6  PROT 7.1  ALBUMIN 4.1   No results for input(s): LIPASE, AMYLASE in the last 168 hours. No results for input(s): AMMONIA in the last 168 hours. Coagulation Profile: Recent Labs  Lab 02/10/18 1052  INR 0.97   Cardiac Enzymes: No results for input(s): CKTOTAL, CKMB, CKMBINDEX, TROPONINI in the last 168 hours. BNP (last 3 results) No results for input(s): PROBNP in the last 8760 hours. HbA1C: Recent Labs    02/11/18 0655  HGBA1C 5.2   CBG: Recent Labs  Lab 02/10/18 1042  GLUCAP 113*   Lipid Profile: Recent Labs    02/11/18 0655  CHOL 218*  HDL 102  LDLCALC 96  TRIG 101  CHOLHDL 2.1   Thyroid Function Tests: No results for input(s): TSH, T4TOTAL, FREET4, T3FREE, THYROIDAB in the last 72 hours. Anemia Panel: No results for input(s): VITAMINB12, FOLATE, FERRITIN, TIBC, IRON, RETICCTPCT in the last 72 hours. Urine analysis:    Component Value Date/Time   COLORURINE YELLOW 02/10/2018 [place] 02/10/2018 1219   LABSPEC 1.013 02/10/2018 1219   PHURINE 7.0 02/10/2018 1219   GLUCOSEU NEGATIVE 02/10/2018 1219   HGBUR NEGATIVE 02/10/2018 1219   BILIRUBINUR NEGATIVE 02/10/2018 1219   KETONESUR NEGATIVE 02/10/2018 1219   PROTEINUR 30 (A) 02/10/2018 1219   UROBILINOGEN 0.2 02/14/2012 0736   NITRITE NEGATIVE 02/10/2018 1219   LEUKOCYTESUR NEGATIVE 02/10/2018 1219   Sepsis Labs: @LABRCNTIP (procalcitonin:4,lacticidven:4)  )No results found for this or any previous visit (from the past 240 hour(s)).    Radiology Studies: Dg Chest 2 View  Result Date: 02/10/2018 CLINICAL DATA:  COPD and wheezing.  Altered mental status. EXAM: CHEST - 2 VIEW COMPARISON:  Chest x-ray dated February 03, 2013. FINDINGS: The heart size and mediastinal contours are within normal limits. Normal pulmonary vascularity. The lungs remain hyperexpanded with emphysematous changes. Scarring in the right upper lobe. No focal consolidation, pleural effusion, or pneumothorax. Mild to moderate compression deformity of a midthoracic vertebral body is unchanged. Old healed fracture deformity of the left proximal humerus. No acute osseous abnormality. IMPRESSION: 1. COPD.  No active cardiopulmonary disease. Electronically Signed   By: Titus Dubin M.D.   On: 02/10/2018 13:49   Ct Head Wo Contrast  Result Date: 02/10/2018 CLINICAL DATA:  Confusion since yesterday. EXAM: CT HEAD WITHOUT CONTRAST TECHNIQUE: Contiguous axial images were obtained from the base of the skull through the vertex without intravenous contrast. COMPARISON:  11/05/2009 FINDINGS: Brain: Advanced generalized cerebral and cerebellar atrophy. Extensive chronic small-vessel ischemic changes throughout the pons in the cerebral hemispheric white matter. Old small lacunar infarctions of the basal ganglia and thalami. No sign of acute infarction, mass lesion, hemorrhage, hydrocephalus or extra-axial collection. Vascular: There is atherosclerotic  calcification of the major vessels at the base of the brain. Skull: Normal Sinuses/Orbits: Clear/normal Other: None IMPRESSION: No acute finding by CT. Advanced atrophy and chronic small-vessel ischemic changes throughout the brain, progressive since 2011. Electronically Signed   By: Nelson Chimes M.D.   On: 02/10/2018 11:35   Mr Jodene Nam Neck Wo Contrast  Result Date: 02/11/2018 CLINICAL DATA:  Acute/subacute nonhemorrhagic pontine infarct. EXAM: MRA NECK WITHOUT CONTRAST MRA HEAD WITHOUT CONTRAST TECHNIQUE: Multiplanar and multiecho pulse sequences of the neck were obtained without intravenous contrast. Angiographic images of the neck were obtained using MRA technique without intravenous contast.; Angiographic images of the Circle of Willis were obtained using MRA technique without intravenous contrast. COMPARISON:  None. FINDINGS: MRA NECK FINDINGS The left vertebral artery is occluded proximally. Right vertebral artery appears to be the dominant vessel. There is no focal stenosis in the right vertebral artery. The right common carotid artery is within normal limits. Mild narrowing approximately 60% is present in the proximal left internal carotid artery with associated atherosclerotic plaque. There is tortuosity of the more distal cervical right ICA without a is tandem stenosis. The left common carotid artery is within normal limits. The left internal carotid artery is occluded just beyond the bifurcation without significant reconstitution in the neck. MRA HEAD FINDINGS Flow is present in the left internal carotid artery at the supraclinoid segment. There is atherosclerotic irregularity within the cavernous right ICA without a significant stenosis. The A1 segments are patent bilaterally. The anterior communicating artery is patent. M1 segments are normal. MCA bifurcations are intact. There is attenuation of MCA branch vessels bilaterally, left greater than right. No significant proximal stenosis or occlusion is  present. ACA vessels are within normal limits. The right vertebral artery is the dominant vessel. There is some flow in the distal left vertebral artery with moderate stenosis beyond the PICA. Both PICA origins are visualized and normal. Basilar artery is normal. The posterior cerebral arteries originate from the basilar tip. Moderate branch vessel stenosis is present bilaterally. IMPRESSION: 1. Occluded left vertebral artery with reconstitution of the distal vertebral artery proximal to the vertebrobasilar junction. 2. Dominant right vertebral artery without focal stenosis. 3. Left internal carotid artery is occluded at the bifurcation with patent anterior communicating artery and normal appearance of the A1 and M1 segments bilaterally. 4. Moderate diffuse medium and small vessel disease. Electronically Signed   By: San Morelle M.D.   On: 02/11/2018 09:16   Mr Brain Wo Contrast  Result Date: 02/10/2018 CLINICAL DATA:  Stroke-like symptoms, unable to walk on her own, with confusion. Symptoms since yesterday. EXAM: MRI HEAD WITHOUT CONTRAST TECHNIQUE: Multiplanar, multiecho pulse sequences of the brain and surrounding structures were obtained without intravenous contrast. COMPARISON:  CT head earlier today FINDINGS: Brain: Moderate-sized acute pontine infarct, RIGHT paramedian location. Corresponding low ADC. No hemorrhage. Atrophy with hydrocephalus ex vacuo.  Mild small vessel disease. No intra-axial mass lesion or extra-axial fluid. No midline shift. Vascular: Dolichoectasia, dolichoectasia of the intracranial circulation. RIGHT internal carotid, basilar, and both vertebrals widely patent. There is a chronic appearing occlusion of the LEFT ICA in its skull base segment. This appears to be reconstituted by collateral flow intracranially. Skull and upper cervical spine: Normal marrow signal. Sinuses/Orbits: BILATERAL cataract extraction. No significant sinus fluid. Other: None. IMPRESSION: Moderate-sized  acute nonhemorrhagic pontine infarct. Wide patency of the adjacent basilar artery suggests small-vessel insult. Advanced atrophy. Chronic appearing occlusion LEFT ICA. Electronically Signed   By: Staci Righter M.D.   On: 02/10/2018 20:59   Mr Jodene Nam Head Wo Contrast  Result Date: 02/11/2018 CLINICAL DATA:  Acute/subacute nonhemorrhagic pontine infarct. EXAM: MRA NECK WITHOUT CONTRAST MRA HEAD WITHOUT CONTRAST TECHNIQUE: Multiplanar and multiecho pulse sequences of the neck were obtained without intravenous contrast. Angiographic images of the neck were obtained using MRA technique without intravenous contast.; Angiographic images of the Circle of Willis were obtained using MRA technique without intravenous contrast. COMPARISON:  None. FINDINGS: MRA NECK FINDINGS The left vertebral artery is occluded proximally. Right vertebral artery appears to be the dominant vessel. There is no focal stenosis in the right vertebral artery. The right common carotid artery is within normal limits. Mild narrowing approximately 60% is present in the proximal left internal carotid artery with associated atherosclerotic plaque. There is tortuosity of the more distal cervical right ICA without a is tandem stenosis. The left common carotid artery is within normal limits. The left internal carotid artery is occluded just beyond the bifurcation without significant reconstitution in the neck. MRA HEAD FINDINGS Flow is present in the left internal carotid artery at the supraclinoid segment. There is atherosclerotic irregularity within the cavernous right ICA without a significant stenosis. The A1 segments are patent bilaterally. The anterior communicating artery is patent. M1 segments are normal. MCA bifurcations are intact. There is attenuation of MCA branch vessels bilaterally, left greater than right. No significant proximal stenosis or occlusion is present. ACA vessels are within normal limits. The right vertebral artery is the dominant  vessel. There is some flow in the distal left vertebral artery with moderate stenosis beyond the PICA. Both PICA origins are visualized and normal. Basilar artery is normal. The posterior cerebral arteries originate from the basilar tip. Moderate branch vessel stenosis is present bilaterally. IMPRESSION: 1. Occluded left vertebral artery with reconstitution of the distal vertebral artery proximal to the vertebrobasilar junction. 2. Dominant right vertebral artery without focal stenosis. 3. Left internal carotid artery is occluded at the bifurcation with patent anterior communicating artery and normal appearance of the A1 and M1 segments bilaterally. 4. Moderate diffuse medium and small vessel disease. Electronically Signed   By: San Morelle M.D.   On: 02/11/2018 09:16     Scheduled Meds: . aspirin EC  81 mg Oral QPM  . clonazePAM  0.5 mg Oral QHS  . cloNIDine  0.2 mg Oral QHS  . clopidogrel  75 mg Oral Daily  . enoxaparin (LOVENOX) injection  30 mg Subcutaneous Q24H  . feeding supplement (ENSURE ENLIVE)  237 mL Oral BID BM  . metoprolol succinate  25 mg Oral BID  . mometasone-formoterol  2 puff Inhalation BID  . OLANZapine  2.5 mg Oral QHS  . theophylline  150 mg Oral Daily  . vitamin B-12  1,000 mcg Oral Daily  . vitamin C  500 mg Oral Daily  . vitamin E  400 Units Oral Daily  Continuous Infusions: . sodium chloride 75 mL/hr at 02/11/18 1111     LOS: 1 day   Time Spent in minutes   45 minutes  Thadd Apuzzo D.O. on 02/11/2018 at 2:21 PM  Between 7am to 7pm - Pager - [phone]  After 7pm go to [email or web address] - password [email or web address]  And look for the night coverage person covering for me after hours  [facility] Office  [phone]

## 2018-02-11 NOTE — Evaluation (Signed)
Speech Language Pathology Evaluation Patient Details Name: Erin Cox MRN: [id number] DOB: Mar 15, 1921 Today's Date: 02/11/2018 Time: 5809-9833 SLP Time Calculation (min) (ACUTE ONLY): 27 min  Problem List:  Patient Active Problem List   Diagnosis Date Noted  . Stroke (cerebrum) ([place]) 02/10/2018  . Acute metabolic encephalopathy 82/50/5397  . CKD (chronic kidney disease), stage III ([place]) 02/10/2018  . COPD (chronic obstructive pulmonary disease) ([place]) 02/10/2018  . Anxiety 02/10/2018  . Protein-calorie malnutrition, moderate ([place]) 02/10/2018  . Essential hypertension 02/10/2018  . TIA (transient ischemic attack)   . Carotid artery obstruction    Past Medical History:  Past Medical History:  Diagnosis Date  . Asthma   . Carotid artery obstruction   . COPD (chronic obstructive pulmonary disease) ([place])   . Hypertension   . TIA (transient ischemic attack)    Past Surgical History:  Past Surgical History:  Procedure Laterality Date  . APPENDECTOMY     HPI:  Erin Cox is a 84 y.o. female with a history of hypertension, TIA, COPD, and asthma who presents with 2 days of unsteady gait, slurred speech.  Lives independently PTA. Never before seen by SLP. MRI (4/18) revealed a moderate-sized acute nonhemorrhagic pontine infarct. Wide patency of the adjacent basilar artery suggests small-vessel insult, advanced atrophy, and chronic appearing occlusion left ICA.   Assessment / Plan / Recommendation Clinical Impression   Pt presents with Moderate cognitive deficits in sustained attention, reasoning, executive functioning, safety/judgement, awareness and orientation. Pt benefited from Mod verbal cues and choice of two to orient to time and to answer questions about current events. Pt's increased processing time was aided by Mod repetition and rephrasing. Pt's auditory comprehension and expression appeared [facility] during assessment. Pt's family in room, reporting pt has had a mild decline in  memory over the past year, but she was able to live independently with family checking in on her most days. Given difference from baseline and identified deficits, recommend follow up with SLP to maximize independence and follow up with SLP in next venue of care.     SLP Assessment  SLP Recommendation/Assessment: Patient needs continued Speech Lanaguage Pathology Services SLP Visit Diagnosis: Cognitive communication deficit (R41.841)    Follow Up Recommendations  Inpatient Rehab    Frequency and Duration min 2x/week  2 weeks      SLP Evaluation Cognition  Overall Cognitive Status: Impaired/Different from baseline Arousal/Alertness: Awake/alert Orientation Level: Oriented to person;Disoriented to time;Oriented to place;Disoriented to situation Attention: Sustained Sustained Attention: Impaired Sustained Attention Impairment: Verbal basic Memory: Impaired Memory Impairment: Storage deficit Awareness: Impaired Awareness Impairment: Intellectual impairment Problem Solving: Impaired Problem Solving Impairment: Verbal basic Executive Function: Self Monitoring;Organizing Organizing: Impaired Organizing Impairment: Verbal basic Self Monitoring: Impaired Self Monitoring Impairment: Verbal basic Safety/Judgment: Impaired       Comprehension  Auditory Comprehension Overall Auditory Comprehension: Appears within functional limits for tasks assessed Visual Recognition/Discrimination Discrimination: Not tested Reading Comprehension Reading Status: Not tested    Expression Expression Primary Mode of Expression: Verbal Verbal Expression Overall Verbal Expression: Appears within functional limits for tasks assessed Written Expression Dominant Hand: Right Written Expression: Not tested   Oral / Motor  Oral Motor/Sensory Function Overall Oral Motor/Sensory Function: Within functional limits Motor Speech Overall Motor Speech: Appears within functional limits for tasks assessed    GO                   [place] Capers SLP Student Clinician  [place] Vernelle Wisner 02/11/2018, 4:57 PM

## 2018-02-11 NOTE — Evaluation (Addendum)
Occupational Therapy Evaluation Patient Details Name: Erin Cox MRN: [id number] DOB: 12-Sep-1921 Today's Date: 02/11/2018    History of Present Illness Erin Cox is a 84 y.o. female with medical history significant of hypertension, COPD, TIA, anxiety, coronary artery stenosis, CK-3, who presents with altered mental status, slurred speech, gait instability.   Clinical Impression   Pt with decline in function and safety with ADLs and ADL mobility with decreased strength, balance, coordination and endurance. Pt requires increased assist with ADLs and ADL mobility as she used no AD PTA. Pt lives at home alone. Pt would benefit from acute OT services to maximize level of function and safety    Follow Up Recommendations  CIR    Equipment Recommendations  None recommended by OT;Other (comment)(TBD at next venue of care)    Recommendations for Other Services Rehab consult     Precautions / Restrictions Precautions Precautions: Fall Precaution Comments: Restrictions Weight Bearing Restrictions: No      Mobility Bed Mobility Overal bed mobility: Needs Assistance Bed Mobility: Supine to Sit     Supine to sit: Min guard        Transfers Overall transfer level: Needs assistance Equipment used: ;2 person hand held assist Transfers: Sit to/from [facility] Sit to Stand: Min assist Stand pivot transfers: Min assist       General transfer comment: min A sit - stand from toilet.    Balance Overall balance assessment: Needs assistance Sitting-balance support: No upper extremity supported;Feet supported Sitting balance-Leahy Scale: Fair     Standing balance support: Single extremity supported;Bilateral upper extremity supported;During functional activity Standing balance-Leahy Scale: Poor                             ADL either performed or assessed with clinical judgement   ADL Overall ADL's : Needs assistance/impaired     Grooming:  Wash/dry hands;Wash/dry face;Minimal assistance;Standing   Upper Body Bathing: Minimal assistance   Lower Body Bathing: Maximal assistance   Upper Body Dressing : Minimal assistance   Lower Body Dressing: Maximal assistance   Toilet Transfer: Moderate assistance;Ambulation;Comfort height toilet;Grab bars;RW   Toileting- Clothing Manipulation and Hygiene: Maximal assistance;Sit to/from stand       Functional mobility during ADLs: Moderate assistance General ADL Comments: ADL mobility assist fluctauting during session     Vision Baseline Vision/History:  Patient Visual Report: No change from baseline       Perception     Praxis      Pertinent Vitals/Pain Pain Assessment: No/denies pain     Hand Dominance Right   Extremity/Trunk Assessment Upper Extremity Assessment Upper Extremity Assessment: Generalized weakness;LUE deficits/detail LUE Deficits / Details:  Lower Extremity Assessment Lower Extremity Assessment: Defer to PT evaluation   Cervical / Trunk Assessment Cervical / Trunk Assessment: Kyphotic   Communication Communication Communication: No difficulties   Cognition Arousal/Alertness: Awake/alert Behavior During Therapy: Impulsive Overall Cognitive Status: Impaired/Different from baseline Area of Impairment: Attention;Safety/judgement;Problem solving                         Safety/Judgement: Decreased awareness of safety;Decreased awareness of deficits   Problem Solving: Difficulty sequencing;Requires verbal cues;Requires tactile cues     General Comments       Exercises     Shoulder Instructions      Home Living Family/patient expects to be discharged to:: Private residence Living Arrangements: Alone;Other relatives Available Help at Discharge:  Family Type of Home: House Home Access: Stairs to enter [employer] of Steps: 0   Home Layout: One level     Bathroom Shower/Tub: Tub/shower unit;Walk-in shower          Home Equipment: Kasandra Knudsen - single point;Tub bench;Adaptive equipment Adaptive Equipment: Reacher        Prior Functioning/Environment Level of Independence: Independent with assistive device(s);Needs assistance  Gait / Transfers Assistance Needed: uses SPC for mobility ADL's / Homemaking Assistance Needed: pt was independent with ADLs, home mgt, was driving, 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   Comments: L hand  and eye with hx of impairments        OT Problem List: Decreased strength;Decreased activity tolerance;Decreased cognition;Decreased knowledge of use of DME or AE;Impaired UE functional use;Decreased safety awareness;Decreased coordination;Impaired balance (sitting and/or standing);Decreased range of motion      OT Treatment/Interventions: Self-care/ADL training;DME and/or AE instruction;Therapeutic activities;Balance training;Therapeutic exercise;Neuromuscular education;Patient/family education    OT Goals(Current goals can be found in the care plan section) Acute Rehab OT Goals Patient Stated Goal: get better OT Goal Formulation: With patient/family Time For Goal Achievement: 02/25/18 Potential to Achieve Goals: Good ADL Goals Pt Will Perform Grooming: with min guard assist Pt Will Perform Upper Body Bathing: with min guard assist;sitting Pt Will Perform Lower Body Bathing: with mod assist;sit to/from stand Pt Will Perform Upper Body Dressing: with min guard assist Pt Will Transfer to Toilet: with min assist;ambulating;regular height toilet;grab bars Pt Will Perform Toileting - Clothing Manipulation and hygiene: with mod assist;sit to/from stand  OT Frequency: Min 2X/week   Barriers to D/C: Decreased caregiver support          Co-evaluation PT/OT/SLP Co-Evaluation/Treatment: Yes Reason for Co-Treatment: For patient/therapist safety;To address functional/ADL transfers   OT goals addressed during session: ADL's and self-care      AM-PAC PT "6 Clicks" Daily  Activity     Outcome Measure Help from another person eating meals?: None Help from another person taking care of personal grooming?: A Little Help from another person toileting, which includes using toliet, bedpan, or urinal?: A Lot Help from another person bathing (including washing, rinsing, drying)?: A Lot Help from another person to put on and taking off regular upper body clothing?: A Little Help from another person to put on and taking off regular lower body clothing?: A Lot 6 Click Score: 16   End of Session Equipment Utilized During Treatment: Gait belt;Rolling walker;Other (comment)(SPC)  Activity Tolerance: Patient tolerated treatment well Patient left: in chair;with chair alarm set;with family/visitor present  OT Visit Diagnosis: Unsteadiness on feet (R26.81);Other abnormalities of gait and mobility (R26.89);Other symptoms and signs involving the nervous system (R29.898);Muscle weakness (generalized) (M62.81);History of falling (Z91.81)                Time: 3546-5681 OT Time Calculation (min): 26 min Charges:  OT General Charges $OT Visit: 1 Visit OT Evaluation $OT Eval Moderate Complexity: 1 Mod G-Codes: OT G-codes **NOT FOR INPATIENT CLASS** Functional Assessment Tool Used: AM-PAC 6 Clicks Daily Activity     Britt Bottom 02/11/2018, 2:53 PM

## 2018-02-11 NOTE — Progress Notes (Signed)
PT Cancellation Note  Patient Details Name: Erin Cox MRN: [id number] DOB: 1921-06-08   Cancelled Treatment:    Reason Eval/Treat Not Completed: Patient at procedure or test/unavailable(MRI will try back)   Duncan Dull 02/11/2018, 8:04 AM Alben Deeds, PT DPT  Board Certified Neurologic Specialist [phone]

## 2018-02-11 NOTE — Progress Notes (Signed)
Initial Nutrition Assessment  DOCUMENTATION CODES:   Severe malnutrition in context of chronic illness  INTERVENTION:   Recommend liberalizing pt's diet to promote PO intake.   Ensure Enlive po BID, each supplement provides 350 kcal and 20 grams of protein   NUTRITION DIAGNOSIS:   Severe Malnutrition related to chronic illness(COPD) as evidenced by severe muscle depletion, severe fat depletion  GOAL:   Patient will meet greater than or equal to 90% of their needs  MONITOR:   PO intake, Supplement acceptance, Labs, I & O's  REASON FOR ASSESSMENT:   Consult Assessment of nutrition requirement/status  ASSESSMENT:   Pt with PMH of COPD, HTN, asthma presents with AMS, slurred speech and difficulty walking found stroke   Spoke with RN, pt, and pt's granddaughter in law at bedside. Together report pt has has decreased appetite.  Pt consumes 1 snack size sausage biscuit for breakfast, bites/sips for lunch and dinner, and 1 Ensure per day. Pt amenable to continued supplementation while admitted.  Pt endorses weight loss but unsure of amount. Pt's family member reports a UBW of ~95 lbs.   Labs reviewed; CBG 113, BUN 29, Creatinine 1.20, Hemglobin A1C 5.2 (WNL) Medications reviewed; vitamin B12, vitamin C, vitamin E, NS at 75 mL/hr  NUTRITION - FOCUSED PHYSICAL EXAM:    Most Recent Value  Orbital Region  Severe depletion  Upper Arm Region  Severe depletion  Thoracic and Lumbar Region  Severe depletion  Buccal Region  Moderate depletion  Temple Region  Severe depletion  Clavicle Bone Region  Severe depletion  Clavicle and Acromion Bone Region  Severe depletion  Scapular Bone Region  Severe depletion  Dorsal Hand  Severe depletion  Patellar Region  Severe depletion  Anterior Thigh Region  Moderate depletion  Posterior Calf Region  Severe depletion  Edema (RD Assessment)  None  Hair  Reviewed  Eyes  Reviewed  Mouth  Reviewed  Skin  Reviewed [bruising at both hands]   Nails  Reviewed     Diet Order:  Fall precautions Diet Heart Room service appropriate? Yes; Fluid consistency: Thin  EDUCATION NEEDS:   Education needs have been addressed  Skin:  Skin Assessment: Reviewed RN Assessment  Last BM:  02/10/18  Height:   Ht Readings from Last 1 Encounters:  02/10/18 4\' 2"  (1.27 m)    Weight:   Wt Readings from Last 1 Encounters:  02/10/18 85 lb 5.1 oz (38.7 kg)    Ideal Body Weight:  37.9 kg  BMI:  Body mass index is 23.99 kg/m.  Estimated Nutritional Needs:   Kcal:  1200-1400  Protein:  60-70 grams  Fluid:  >/= 1.4 L/d  Marjo Bicker, MS, RDN, LDN 02/11/2018 12:27 PM

## 2018-02-11 NOTE — Progress Notes (Signed)
OT Cancellation Note  Patient Details Name: Erin Cox MRN: [id number] DOB: 1921-10-19   Cancelled Treatment:    Reason Eval/Treat Not Completed: Patient at procedure or test/ unavailable. Pt at MRI, will try back later as able/as appropriate  Britt Bottom 02/11/2018, 9:22 AM

## 2018-02-11 NOTE — Progress Notes (Signed)
  Echocardiogram 2D Echocardiogram has been performed.  Erin Cox 02/11/2018, 10:31 AM

## 2018-02-11 NOTE — Progress Notes (Signed)
Pt from Home admitted through [facility] c/o  Rt facial droop and AMS and difficulty walking . Marland Kitchen Pt now alert and oriented to person place and month and day but forgetful of the year. She has no facial droop upon arrival to the unit.  Follows command appropriately, moves all extremities finger.  to nose smooth bilaterallybut ataxic  with BLE denied pain  SOB nor chest pain.  Cardiac monitor in use SR & verification completed. BP slightly high. Will continue to monitor. [place] daughter in law at bedside.  Safety  &Plan of care revived, call bell within reach. To call for assist.

## 2018-02-12 ENCOUNTER — Encounter (HOSPITAL_COMMUNITY): Payer: Self-pay | Admitting: *Deleted

## 2018-02-12 ENCOUNTER — Other Ambulatory Visit: Payer: Self-pay

## 2018-02-12 DIAGNOSIS — I6522 Occlusion and stenosis of left carotid artery: Secondary | ICD-10-CM

## 2018-02-12 DIAGNOSIS — E785 Hyperlipidemia, unspecified: Secondary | ICD-10-CM

## 2018-02-12 DIAGNOSIS — I6302 Cerebral infarction due to thrombosis of basilar artery: Secondary | ICD-10-CM

## 2018-02-12 NOTE — Progress Notes (Signed)
PROGRESS NOTE    Erin Cox  VQQ:[id number] DOB: 04/07/1921 DOA: 02/10/2018 PCP: [name], MD   Brief Narrative:  HPI On 02/10/2018 by Dr. Arie Sabina Erin Cox is a 84 y.o. female with medical history significant of hypertension, COPD, TIA, anxiety, coronary artery stenosis, CK-3, who presents with altered mental status, slurred speech, gait instability.  Per her granddaughter, patient was noted to be mildly confused yesterday morning at about 7 AM. She also has mild slurred speech and unsteady gait. Patient is forgetful than her baseline. No vision change or hearing loss. Patient moves all extremities normally, does not seem to have unilateral weakness in extremities per her granddaughter. Patient refused to come to the hospital yesterday. Patient does not have chest pain, shortness breath, cough, fever or chills. No nausea, vomiting, diarrhea or abdominal pain. Patient has increased urinary frequency, but no dysuria or burning on urination.  Interim history Found to have acute CVA.  PT OT recommended inpatient rehab.  Pending inpatient rehab assessment.  Assessment & Plan   Acute CVA -Patient presented with unsteady gait as well as slurring speech -CT head showed no acute finding -MRI brain showed moderate sized acute nonhemorrhagic pontine infarct.  Chronic appearing occlusion left ICA -MRA neck and head: Occluded left vertebral artery with reconstitution of the distal vertebral artery proximal to the vertebrobasilar junction.  Left ICA is occluded -Echocardiogram: EF of 43-32%, grade 1 diastolic dysfunction -LDL 96, hemoglobin A1c 5.2 -PT and OT recommended inpatient rehab -Currently on aspirin, Plavix -Patient has a statin intolerance -Neurology consulted and appreciated.  Discussed with Dr. Leonie Man, recommended starting Crestor 5 mg daily along with co-Q10 200 mg daily -inpatient rehab consulted and appreciated, will also consult case social worker for possible SNF  placement  Acute metabolic encephalopathy -possibly has underlying dementia vs secondary to CVA -Currently stable -Continue to monitor  Essential hypertension -Clonidine and metoprolol continued -Plendil, Cozaar held due to CVA, allowing for permissive hypertension  Hyperlipidemia -Panel shows a total cholesterol 218, HDL 102, LDL 96, triglycerides 101 -Patient has had a statin intolerance in the past.  Neurology recommended starting Crestor 5 mg daily along with co-Q10  Chronic kidney disease, Stage III -Stable, continue to monitor BMP  COPD -Stable, continue Dulera, theophylline nebs  Anxiety  -Continue clonidine   Severe protein calorie malnutrition -nutrition consult , continue feeding supplements  DVT Prophylaxis  Lovenox  Code Status: DNR  Family Communication: Family at bedside  Disposition Plan: Admitted, dispo pending- CIR vs SNF  Consultants Neurology Inpatient rehab  Procedures  Echocardiogram  Antibiotics   Anti-infectives (From admission, onward)   None      Subjective:   Erin Cox seen and examined today.  Patient denies any current chest pain, shortness breath, abdominal pain, nausea vomiting, diarrhea constipation.  Wishes to go home, does not want to go to rehab.  Objective:   Vitals:   02/11/18 1520 02/11/18 2200 02/12/18 0501 02/12/18 0921  BP: 129/71 (!) 176/103 (!) 150/95   Pulse: 85  81   Resp: 16 16 18    Temp: 98.8 F (37.1 C) 98.4 F (36.9 C) 98 F (36.7 C)   TempSrc: Oral Oral Oral   SpO2: 96% 96% 97% 98%  Weight:      Height:        Intake/Output Summary (Last 24 hours) at 02/12/2018 1344 Last data filed at 02/12/2018 1300 Gross per 24 hour  Intake 1350 ml  Output 850 ml  Net 500 ml  Filed Weights   02/10/18 2357  Weight: 38.7 kg (85 lb 5.1 oz)   Exam  General: Well developed, elderly, thin, no apparent distress  HEENT: NCAT, mucous membranes moist.   Neck: Supple  Cardiovascular: S1 S2 auscultated, no  rubs, murmurs or gallops. Regular rate and rhythm.  Respiratory: Clear to auscultation bilaterally with equal chest rise  Abdomen: Soft, nontender, nondistended, + bowel sounds  Extremities: warm dry without cyanosis clubbing or edema  Neuro: AAOx2 (person, place), nonfocal  Psych: Normal affect and demeanor, poor judgment and insight  Data Reviewed: I have personally reviewed following labs and imaging studies  CBC: Recent Labs  Lab 02/10/18 1052 02/10/18 1105  WBC 6.2  --   NEUTROABS 4.5  --   HGB 12.9 13.6  HCT 40.8 40.0  MCV 95.8  --   PLT 233  --    Basic Metabolic Panel: Recent Labs  Lab 02/10/18 1052 02/10/18 1105  NA 140 140  K 4.1 4.1  CL 102 102  CO2 27  --   GLUCOSE 109* 105*  BUN 28* 29*  CREATININE 1.24* 1.20*  CALCIUM 10.2  --    GFR: Estimated Creatinine Clearance: 13.1 mL/min (A) (by C-G formula based on SCr of 1.2 mg/dL (H)). Liver Function Tests: Recent Labs  Lab 02/10/18 1052  AST 24  ALT 15  ALKPHOS 102  BILITOT 0.6  PROT 7.1  ALBUMIN 4.1   No results for input(s): LIPASE, AMYLASE in the last 168 hours. No results for input(s): AMMONIA in the last 168 hours. Coagulation Profile: Recent Labs  Lab 02/10/18 1052  INR 0.97   Cardiac Enzymes: No results for input(s): CKTOTAL, CKMB, CKMBINDEX, TROPONINI in the last 168 hours. BNP (last 3 results) No results for input(s): PROBNP in the last 8760 hours. HbA1C: Recent Labs    02/11/18 0655  HGBA1C 5.2   CBG: Recent Labs  Lab 02/10/18 1042  GLUCAP 113*   Lipid Profile: Recent Labs    02/11/18 0655  CHOL 218*  HDL 102  LDLCALC 96  TRIG 101  CHOLHDL 2.1   Thyroid Function Tests: No results for input(s): TSH, T4TOTAL, FREET4, T3FREE, THYROIDAB in the last 72 hours. Anemia Panel: No results for input(s): VITAMINB12, FOLATE, FERRITIN, TIBC, IRON, RETICCTPCT in the last 72 hours. Urine analysis:    Component Value Date/Time   COLORURINE YELLOW 02/10/2018 [place] 02/10/2018 1219   LABSPEC 1.013 02/10/2018 1219   PHURINE 7.0 02/10/2018 1219   GLUCOSEU NEGATIVE 02/10/2018 1219   HGBUR NEGATIVE 02/10/2018 1219   BILIRUBINUR NEGATIVE 02/10/2018 1219   KETONESUR NEGATIVE 02/10/2018 1219   PROTEINUR 30 (A) 02/10/2018 1219   UROBILINOGEN 0.2 02/14/2012 0736   NITRITE NEGATIVE 02/10/2018 1219   LEUKOCYTESUR NEGATIVE 02/10/2018 1219   Sepsis Labs: @LABRCNTIP (procalcitonin:4,lacticidven:4)  )No results found for this or any previous visit (from the past 240 hour(s)).    Radiology Studies: Dg Chest 2 View  Result Date: 02/10/2018 CLINICAL DATA:  COPD and wheezing.  Altered mental status. EXAM: CHEST - 2 VIEW COMPARISON:  Chest x-ray dated February 03, 2013. FINDINGS: The heart size and mediastinal contours are within normal limits. Normal pulmonary vascularity. The lungs remain hyperexpanded with emphysematous changes. Scarring in the right upper lobe. No focal consolidation, pleural effusion, or pneumothorax. Mild to moderate compression deformity of a midthoracic vertebral body is unchanged. Old healed fracture deformity of the left proximal humerus. No acute osseous abnormality. IMPRESSION: 1. COPD.  No active cardiopulmonary disease. Electronically Signed  By: Titus Dubin M.D.   On: 02/10/2018 13:49   Mr Jodene Nam Neck Wo Contrast  Result Date: 02/11/2018 CLINICAL DATA:  Acute/subacute nonhemorrhagic pontine infarct. EXAM: MRA NECK WITHOUT CONTRAST MRA HEAD WITHOUT CONTRAST TECHNIQUE: Multiplanar and multiecho pulse sequences of the neck were obtained without intravenous contrast. Angiographic images of the neck were obtained using MRA technique without intravenous contast.; Angiographic images of the Circle of Willis were obtained using MRA technique without intravenous contrast. COMPARISON:  None. FINDINGS: MRA NECK FINDINGS The left vertebral artery is occluded proximally. Right vertebral artery appears to be the dominant vessel. There is  no focal stenosis in the right vertebral artery. The right common carotid artery is within normal limits. Mild narrowing approximately 60% is present in the proximal left internal carotid artery with associated atherosclerotic plaque. There is tortuosity of the more distal cervical right ICA without a is tandem stenosis. The left common carotid artery is within normal limits. The left internal carotid artery is occluded just beyond the bifurcation without significant reconstitution in the neck. MRA HEAD FINDINGS Flow is present in the left internal carotid artery at the supraclinoid segment. There is atherosclerotic irregularity within the cavernous right ICA without a significant stenosis. The A1 segments are patent bilaterally. The anterior communicating artery is patent. M1 segments are normal. MCA bifurcations are intact. There is attenuation of MCA branch vessels bilaterally, left greater than right. No significant proximal stenosis or occlusion is present. ACA vessels are within normal limits. The right vertebral artery is the dominant vessel. There is some flow in the distal left vertebral artery with moderate stenosis beyond the PICA. Both PICA origins are visualized and normal. Basilar artery is normal. The posterior cerebral arteries originate from the basilar tip. Moderate branch vessel stenosis is present bilaterally. IMPRESSION: 1. Occluded left vertebral artery with reconstitution of the distal vertebral artery proximal to the vertebrobasilar junction. 2. Dominant right vertebral artery without focal stenosis. 3. Left internal carotid artery is occluded at the bifurcation with patent anterior communicating artery and normal appearance of the A1 and M1 segments bilaterally. 4. Moderate diffuse medium and small vessel disease. Electronically Signed   By: San Morelle M.D.   On: 02/11/2018 09:16   Mr Brain Wo Contrast  Result Date: 02/10/2018 CLINICAL DATA:  Stroke-like symptoms, unable to  walk on her own, with confusion. Symptoms since yesterday. EXAM: MRI HEAD WITHOUT CONTRAST TECHNIQUE: Multiplanar, multiecho pulse sequences of the brain and surrounding structures were obtained without intravenous contrast. COMPARISON:  CT head earlier today FINDINGS: Brain: Moderate-sized acute pontine infarct, RIGHT paramedian location. Corresponding low ADC. No hemorrhage. Atrophy with hydrocephalus ex vacuo. Mild small vessel disease. No intra-axial mass lesion or extra-axial fluid. No midline shift. Vascular: Dolichoectasia, dolichoectasia of the intracranial circulation. RIGHT internal carotid, basilar, and both vertebrals widely patent. There is a chronic appearing occlusion of the LEFT ICA in its skull base segment. This appears to be reconstituted by collateral flow intracranially. Skull and upper cervical spine: Normal marrow signal. Sinuses/Orbits: BILATERAL cataract extraction. No significant sinus fluid. Other: None. IMPRESSION: Moderate-sized acute nonhemorrhagic pontine infarct. Wide patency of the adjacent basilar artery suggests small-vessel insult. Advanced atrophy. Chronic appearing occlusion LEFT ICA. Electronically Signed   By: Staci Righter M.D.   On: 02/10/2018 20:59   Mr Jodene Nam Head Wo Contrast  Result Date: 02/11/2018 CLINICAL DATA:  Acute/subacute nonhemorrhagic pontine infarct. EXAM: MRA NECK WITHOUT CONTRAST MRA HEAD WITHOUT CONTRAST TECHNIQUE: Multiplanar and multiecho pulse sequences of the neck were obtained without  intravenous contrast. Angiographic images of the neck were obtained using MRA technique without intravenous contast.; Angiographic images of the Circle of Willis were obtained using MRA technique without intravenous contrast. COMPARISON:  None. FINDINGS: MRA NECK FINDINGS The left vertebral artery is occluded proximally. Right vertebral artery appears to be the dominant vessel. There is no focal stenosis in the right vertebral artery. The right common carotid artery is  within normal limits. Mild narrowing approximately 60% is present in the proximal left internal carotid artery with associated atherosclerotic plaque. There is tortuosity of the more distal cervical right ICA without a is tandem stenosis. The left common carotid artery is within normal limits. The left internal carotid artery is occluded just beyond the bifurcation without significant reconstitution in the neck. MRA HEAD FINDINGS Flow is present in the left internal carotid artery at the supraclinoid segment. There is atherosclerotic irregularity within the cavernous right ICA without a significant stenosis. The A1 segments are patent bilaterally. The anterior communicating artery is patent. M1 segments are normal. MCA bifurcations are intact. There is attenuation of MCA branch vessels bilaterally, left greater than right. No significant proximal stenosis or occlusion is present. ACA vessels are within normal limits. The right vertebral artery is the dominant vessel. There is some flow in the distal left vertebral artery with moderate stenosis beyond the PICA. Both PICA origins are visualized and normal. Basilar artery is normal. The posterior cerebral arteries originate from the basilar tip. Moderate branch vessel stenosis is present bilaterally. IMPRESSION: 1. Occluded left vertebral artery with reconstitution of the distal vertebral artery proximal to the vertebrobasilar junction. 2. Dominant right vertebral artery without focal stenosis. 3. Left internal carotid artery is occluded at the bifurcation with patent anterior communicating artery and normal appearance of the A1 and M1 segments bilaterally. 4. Moderate diffuse medium and small vessel disease. Electronically Signed   By: San Morelle M.D.   On: 02/11/2018 09:16     Scheduled Meds: . aspirin EC  81 mg Oral QPM  . clonazePAM  0.5 mg Oral QHS  . cloNIDine  0.2 mg Oral QHS  . clopidogrel  75 mg Oral Daily  . enoxaparin (LOVENOX) injection   30 mg Subcutaneous Q24H  . feeding supplement (ENSURE ENLIVE)  237 mL Oral BID BM  . metoprolol succinate  25 mg Oral BID  . mometasone-formoterol  2 puff Inhalation BID  . OLANZapine  2.5 mg Oral QHS  . rosuvastatin  5 mg Oral q1800  . theophylline  150 mg Oral Daily  . vitamin B-12  1,000 mcg Oral Daily  . vitamin C  500 mg Oral Daily  . vitamin E  400 Units Oral Daily   Continuous Infusions: . sodium chloride 75 mL/hr at 02/12/18 0700     LOS: 2 days   Time Spent in minutes   30 minutes  Zethan Alfieri D.O. on 02/12/2018 at 1:44 PM  Between 7am to 7pm - Pager - [phone]  After 7pm go to [email or web address] - password [email or web address]  And look for the night coverage person covering for me after hours  [facility] Office  [phone]

## 2018-02-12 NOTE — Progress Notes (Signed)
STROKE TEAM PROGRESS NOTE   SUBJECTIVE (INTERVAL HISTORY) Her daughter is at the bedside.  Patient still has mild dysarthria. Patient daughter stated that she still needed 2 person assist transition from bed to bedside commode.  PT/OT recommend CIR. as per daughter, patient has left ICA occlusion chronic for a long time.  Taking aspirin and Plavix at home.  Not sure why she takes Tegretol at home.    OBJECTIVE Temp:  [98 F (36.7 C)-99.1 F (37.3 C)] 99.1 F (37.3 C) (04/20 2008) Pulse Rate:  [81-94] 94 (04/20 2008) Cardiac Rhythm: Normal sinus rhythm (04/20 1942) Resp:  [16-18] 16 (04/20 2008) BP: (142-157)/(79-100) 157/100 (04/20 2008) SpO2:  [94 %-98 %] 94 % (04/20 2139)  CBC:  Recent Labs  Lab 02/10/18 1052 02/10/18 1105  WBC 6.2  --   NEUTROABS 4.5  --   HGB 12.9 13.6  HCT 40.8 40.0  MCV 95.8  --   PLT 233  --     Basic Metabolic Panel:  Recent Labs  Lab 02/10/18 1052 02/10/18 1105  NA 140 140  K 4.1 4.1  CL 102 102  CO2 27  --   GLUCOSE 109* 105*  BUN 28* 29*  CREATININE 1.24* 1.20*  CALCIUM 10.2  --     Lipid Panel:     Component Value Date/Time   CHOL 218 (H) 02/11/2018 0655   TRIG 101 02/11/2018 0655   HDL 102 02/11/2018 0655   CHOLHDL 2.1 02/11/2018 0655   VLDL 20 02/11/2018 0655   LDLCALC 96 02/11/2018 0655   HgbA1c:  Lab Results  Component Value Date   HGBA1C 5.2 02/11/2018   Urine Drug Screen: No results found for: LABOPIA, COCAINSCRNUR, LABBENZ, AMPHETMU, THCU, LABBARB  Alcohol Level No results found for: [name] I have personally reviewed the radiological images below and agree with the radiology interpretations.  Ct Head Wo Contrast 02/10/2018 IMPRESSION:  No acute finding by CT. Advanced atrophy and chronic small-vessel ischemic changes throughout the brain, progressive since 2011.   Mr Brain Wo Contrast 02/10/2018 IMPRESSION:  Moderate-sized acute nonhemorrhagic pontine infarct. Wide patency of the adjacent basilar  artery suggests small-vessel insult. Advanced atrophy. Chronic appearing occlusion LEFT ICA.   Mr Jodene Nam Head Wo Contrast MRA Neck 02/11/2018 IMPRESSION:  1. Occluded left vertebral artery with reconstitution of the distal vertebral artery proximal to the vertebrobasilar junction.  2. Dominant right vertebral artery without focal stenosis.  3. Left internal carotid artery is occluded at the bifurcation with patent anterior communicating artery and normal appearance of the A1 and M1 segments bilaterally.  4. Moderate diffuse medium and small vessel disease.   Transthoracic Echocardiogram -  - Left ventricle: The cavity size was normal. There was mild   concentric hypertrophy. Systolic function was vigorous. The   estimated ejection fraction was in the range of 65% to 70%. Wall   motion was normal; there were no regional wall motion   abnormalities. Doppler parameters are consistent with abnormal   left ventricular relaxation (grade 1 diastolic dysfunction).   Doppler parameters are consistent with elevated mean left atrial   filling pressure. - Aortic valve: Valve area (VTI): 1.94 cm^2. Valve area (Vmax): 2   cm^2. Valve area (Vmean): 2.06 cm^2. - Mitral valve: Moderately calcified annulus. Valve area by   continuity equation (using LVOT flow): 1.72 cm^2. - Pulmonary arteries: PA peak pressure: 33 mm Hg (S).   PHYSICAL EXAM Vitals:   02/12/18 0921 02/12/18 1400 02/12/18 2008 02/12/18 2139  BP:  (!) 142/79 (!) 157/100   Pulse:  83 94   Resp:  18 16   Temp:  99 F (37.2 C) 99.1 F (37.3 C)   TempSrc:  Oral Oral   SpO2: 98% 97% 97% 94%  Weight:      Height:       Pleasant elderly lady currently not in distress. Afebrile. Head is nontraumatic. Neck is supple.  Cardiac exam no murmur or gallop. Lungs are clear to auscultation. Distal pulses are well felt.  Neurological exam :  Awake alert oriented x 3. Diminished attention, registration and recall . Mild dysarthric #4 Flonase speech  . Mild left lower face asymmetry. Tongue midline. No UE drift. Mild diminished fine finger movements on left. Orbits right over left upper extremity. Mild left grip weak. BLE 3/5 at least, symmetrical. Normal sensation bilaterally. Normal coordination UEs and LEs, although slow on action. Gait not tested.   ASSESSMENT/PLAN [name] is a 84 y.o. female with history of carotid artery obstruction, asthma, COPD, hypertension, memory problems and previous TIA presenting with unsteady gait and slurred speech. She did not receive IV t-PA due to late presentation.  Stroke: right pontine infarct, likely small vessel disease.  Resultant   Dysarthria and mild left facial weakness.  CT head - No acute finding by CT.  MRI head - Moderate-sized acute nonhemorrhagic pontine infarct.  MRA head and neck -  Left ICA chronic occlusion. Occluded left nondominant VA with distal reconstitution.  2D Echo EF 65 to 70%  LDL - 96  HgbA1c - 5.2  VTE prophylaxis - Lovenox Fall precautions Diet Heart Room service appropriate? Yes; Fluid consistency: Thin  aspirin 81 mg daily and clopidogrel 75 mg daily prior to admission, now on aspirin 81 mg daily and clopidogrel 75 mg daily.  Continue on discharge.  Patient counseled to be compliant with her antithrombotic medications  Ongoing aggressive stroke risk factor management  Therapy recommendations: CIR  Disposition:  Pending  Hypertension  Blood pressure running high at times . Permissive hypertension (OK if < 220/120) but gradually normalize in 5-7 days . Long-term BP goal normotensive  Hyperlipidemia  Lipid lowering medication PTA:  none  LDL 96, goal < 70  Current lipid lowering medication: Crestor 5mg   Continue statin at discharge  Other Stroke Risk Factors  Advanced age  Hx stroke/TIA  CAD   Other Active Problems  Mild cognitive impairment  Hospital day # 2  Neurology will sign off. Please call with questions. Pt will  follow up with stroke clinic NP at [facility] in about 4 weeks. Thanks for the consult.  [name], MD PhD Stroke Neurology 02/12/2018 10:24 PM   To contact Stroke Continuity provider, please refer to [email or web address] After hours, contact General Neurology

## 2018-02-13 DIAGNOSIS — E43 Unspecified severe protein-calorie malnutrition: Secondary | ICD-10-CM

## 2018-02-13 NOTE — Progress Notes (Addendum)
Occupational Therapy Treatment Patient Details Name: Erin Cox MRN: [id number] DOB: 22-Mar-1921 Today's Date: 02/13/2018    History of present illness Erin Cox is a 84 y.o. female with medical history significant of hypertension, COPD, TIA, anxiety, coronary artery stenosis, CK-3, who presents with altered mental status, slurred speech, gait instability.   OT comments  Pt progressing towards OT goals, presented supine in bed pleasant and willing to participate in therapy session. Pt completing bed mobility with Min-ModA, stand pivot transfers during session with ModA+2 with verbal cues for sequencing and proper hand placement during transfer completion; Pt requiring MaxA+2 for toileting ADLs during session completion, requires overall MaxA for LB ADLs, MinA UB ADLs. Feel POC remains appropriate at this time. Will continue to follow acutely to progress pt towards established OT goals.    Follow Up Recommendations  CIR    Equipment Recommendations  Other (comment);None recommended by OT(TBD in next venue )          Precautions / Restrictions Precautions Precautions: Fall Precaution Comments: L visual impairments, hx L sided weakness and decreased AROM from kidney CA per pt's daughter Restrictions Weight Bearing Restrictions: No       Mobility Bed Mobility Overal bed mobility: Needs Assistance Bed Mobility: Supine to Sit;Sit to Supine     Supine to sit: Min assist Sit to supine: Min assist   General bed mobility comments: assist to elevate trunk into sitting; light MinA to guide LEs onto bed when returning to supine   Transfers Overall transfer level: Needs assistance Equipment used: 1 person hand held assist;2 person hand held assist Transfers: Sit to/from [facility] Sit to Stand: Mod assist Stand pivot transfers: Mod assist;+2 safety/equipment       General transfer comment: ModA for sit<>stand from EOB and BSC; ModA +2 (for safety) for stand  pivot to/from [facility], verbal cues for hand placement and assist to guide hips during transfer completion    Balance Overall balance assessment: Needs assistance Sitting-balance support: No upper extremity supported;Feet supported Sitting balance-Leahy Scale: Fair   Postural control: Posterior lean Standing balance support: Single extremity supported;Bilateral upper extremity supported;During functional activity Standing balance-Leahy Scale: Poor                             ADL either performed or assessed with clinical judgement   ADL Overall ADL's : Needs assistance/impaired                         Toilet Transfer: Moderate assistance;Stand-pivot;BSC;Cueing for sequencing;Cueing for safety   Toileting- Clothing Manipulation and Hygiene: Maximal assistance;Sit to/from stand;+2 for physical assistance;+2 for safety/equipment Toileting - Clothing Manipulation Details (indicate cue type and reason): ModA for standing balance while NT assists with pericare after voiding bladder, assist for gown and brief management     Functional mobility during ADLs: Moderate assistance;+2 for physical assistance;+2 for safety/equipment General ADL Comments: Pt requiring ModA+2 for stand pivot transfer EOB<>BSC, requires increased time and multimodal cues, pt appears fearful of falling with mobility requiring increased assist                       Cognition Arousal/Alertness: Awake/alert Behavior During Therapy: WFL for tasks assessed/performed Overall Cognitive Status: Impaired/Different from baseline Area of Impairment: Attention;Safety/judgement;Problem solving  Problem Solving: Difficulty sequencing;Requires verbal cues;Requires tactile cues                            Pertinent Vitals/ Pain       Pain Assessment: No/denies pain                                                           Frequency  Min 2X/week        Progress Toward Goals  OT Goals(current goals can now be found in the care plan section)  Progress towards OT goals: Progressing toward goals  Acute Rehab OT Goals Patient Stated Goal: get better OT Goal Formulation: With patient/family Time For Goal Achievement: 02/25/18 Potential to Achieve Goals: Good  Plan Discharge plan remains appropriate                     AM-PAC PT "6 Clicks" Daily Activity     Outcome Measure   Help from another person eating meals?: None Help from another person taking care of personal grooming?: A Little Help from another person toileting, which includes using toliet, bedpan, or urinal?: A Lot Help from another person bathing (including washing, rinsing, drying)?: A Lot Help from another person to put on and taking off regular upper body clothing?: A Little Help from another person to put on and taking off regular lower body clothing?: A Lot 6 Click Score: 16    End of Session Equipment Utilized During Treatment: Gait belt  OT Visit Diagnosis: Unsteadiness on feet (R26.81);Other abnormalities of gait and mobility (R26.89);Other symptoms and signs involving the nervous system (R29.898);Muscle weakness (generalized) (M62.81);History of falling (Z91.81)   Activity Tolerance Patient tolerated treatment well   Patient Left in bed;with call bell/phone within reach   Nurse Communication Mobility status        Time: 4970-2637 OT Time Calculation (min): 20 min  Charges: OT General Charges $OT Visit: 1 Visit OT Treatments $Self Care/Home Management : 8-22 mins  [name], OT Pager [phone] 02/13/2018    [name] 02/13/2018, 12:42 PM

## 2018-02-13 NOTE — Progress Notes (Signed)
PROGRESS NOTE    Erin Cox  QMG:[id number] DOB: 08/03/1921 DOA: 02/10/2018 PCP: [name], MD   Brief Narrative:  HPI On 02/10/2018 by Dr. Arie Sabina Zidek is a 84 y.o. female with medical history significant of hypertension, COPD, TIA, anxiety, coronary artery stenosis, CK-3, who presents with altered mental status, slurred speech, gait instability.  Per her granddaughter, patient was noted to be mildly confused yesterday morning at about 7 AM. She also has mild slurred speech and unsteady gait. Patient is forgetful than her baseline. No vision change or hearing loss. Patient moves all extremities normally, does not seem to have unilateral weakness in extremities per her granddaughter. Patient refused to come to the hospital yesterday. Patient does not have chest pain, shortness breath, cough, fever or chills. No nausea, vomiting, diarrhea or abdominal pain. Patient has increased urinary frequency, but no dysuria or burning on urination.  Interim history Found to have acute CVA.  PT OT recommended inpatient rehab.  Pending inpatient rehab assessment.  Assessment & Plan   Acute CVA -Patient presented with unsteady gait as well as slurring speech -CT head showed no acute finding -MRI brain showed moderate sized acute nonhemorrhagic pontine infarct.  Chronic appearing occlusion left ICA -MRA neck and head: Occluded left vertebral artery with reconstitution of the distal vertebral artery proximal to the vertebrobasilar junction.  Left ICA is occluded -Echocardiogram: EF of 89-16%, grade 1 diastolic dysfunction -LDL 96, hemoglobin A1c 5.2 -PT and OT recommended inpatient rehab -Currently on aspirin, Plavix -Patient has a statin intolerance -Neurology consulted and appreciated.  Discussed with Dr. Leonie Man, recommended starting Crestor 5 mg daily along with co-Q10 200 mg daily -inpatient rehab consulted and appreciated, social worker consulted for possible SNF placement  Acute  metabolic encephalopathy -possibly has underlying dementia vs secondary to CVA -Currently stable -Continue to monitor  Essential hypertension -Clonidine and metoprolol continued -Plendil, Cozaar held due to CVA, allowing for permissive hypertension  Hyperlipidemia -Panel shows a total cholesterol 218, HDL 102, LDL 96, triglycerides 101 -Patient has had a statin intolerance in the past.  Neurology recommended starting Crestor 5 mg daily along with co-Q10  Chronic kidney disease, Stage III -Stable, continue to monitor BMP  COPD -Stable, continue Dulera, theophylline nebs  Anxiety  -Continue clonidine   Severe protein calorie malnutrition -nutrition consult , continue feeding supplements  DVT Prophylaxis  Lovenox  Code Status: DNR  Family Communication: None at bedside  Disposition Plan: Admitted, dispo pending- CIR vs SNF  Consultants Neurology Inpatient rehab  Procedures  Echocardiogram  Antibiotics   Anti-infectives (From admission, onward)   None      Subjective:   Erin Cox seen and examined today.  Currently denies chest pain, shortness of breath, abdominal pain, N/V/D/C, dizziness, headache. Would like to go home.   Objective:   Vitals:   02/13/18 0428 02/13/18 0836 02/13/18 0931 02/13/18 1100  BP: 115/69 (!) 144/82  (!) 146/84  Pulse: 81 61  91  Resp: 18 18  18   Temp:  97.6 F (36.4 C)  97.7 F (36.5 C)  TempSrc:  Oral  Oral  SpO2: 99% 98% 95% 99%  Weight:      Height:        Intake/Output Summary (Last 24 hours) at 02/13/2018 1224 Last data filed at 02/13/2018 1100 Gross per 24 hour  Intake 2475 ml  Output 900 ml  Net 1575 ml   Filed Weights   02/10/18 2357  Weight: 38.7 kg (85 lb 5.1 oz)  Exam  General: Well developed, thin, elderly, NAD  HEENT: NCAT, mucous membranes moist.   Neck: Supple  Cardiovascular: S1 S2 auscultated, RRR, no murmur   Respiratory: Clear to auscultation bilaterally with equal chest rise  Abdomen:  Soft, nontender, nondistended, + bowel sounds  Extremities: warm dry without cyanosis clubbing or edema  Neuro: AAOx2 (person, place), nonfocal  Psych: pleasant, appropriate mood and affect, poor judgement and insight  Data Reviewed: I have personally reviewed following labs and imaging studies  CBC: Recent Labs  Lab 02/10/18 1052 02/10/18 1105  WBC 6.2  --   NEUTROABS 4.5  --   HGB 12.9 13.6  HCT 40.8 40.0  MCV 95.8  --   PLT 233  --    Basic Metabolic Panel: Recent Labs  Lab 02/10/18 1052 02/10/18 1105  NA 140 140  K 4.1 4.1  CL 102 102  CO2 27  --   GLUCOSE 109* 105*  BUN 28* 29*  CREATININE 1.24* 1.20*  CALCIUM 10.2  --    GFR: Estimated Creatinine Clearance: 13.1 mL/min (A) (by C-G formula based on SCr of 1.2 mg/dL (H)). Liver Function Tests: Recent Labs  Lab 02/10/18 1052  AST 24  ALT 15  ALKPHOS 102  BILITOT 0.6  PROT 7.1  ALBUMIN 4.1   No results for input(s): LIPASE, AMYLASE in the last 168 hours. No results for input(s): AMMONIA in the last 168 hours. Coagulation Profile: Recent Labs  Lab 02/10/18 1052  INR 0.97   Cardiac Enzymes: No results for input(s): CKTOTAL, CKMB, CKMBINDEX, TROPONINI in the last 168 hours. BNP (last 3 results) No results for input(s): PROBNP in the last 8760 hours. HbA1C: Recent Labs    02/11/18 0655  HGBA1C 5.2   CBG: Recent Labs  Lab 02/10/18 1042  GLUCAP 113*   Lipid Profile: Recent Labs    02/11/18 0655  CHOL 218*  HDL 102  LDLCALC 96  TRIG 101  CHOLHDL 2.1   Thyroid Function Tests: No results for input(s): TSH, T4TOTAL, FREET4, T3FREE, THYROIDAB in the last 72 hours. Anemia Panel: No results for input(s): VITAMINB12, FOLATE, FERRITIN, TIBC, IRON, RETICCTPCT in the last 72 hours. Urine analysis:    Component Value Date/Time   COLORURINE YELLOW 02/10/2018 [place] 02/10/2018 1219   LABSPEC 1.013 02/10/2018 1219   PHURINE 7.0 02/10/2018 1219   GLUCOSEU NEGATIVE 02/10/2018  1219   HGBUR NEGATIVE 02/10/2018 1219   BILIRUBINUR NEGATIVE 02/10/2018 1219   KETONESUR NEGATIVE 02/10/2018 1219   PROTEINUR 30 (A) 02/10/2018 1219   UROBILINOGEN 0.2 02/14/2012 0736   NITRITE NEGATIVE 02/10/2018 1219   LEUKOCYTESUR NEGATIVE 02/10/2018 1219   Sepsis Labs: @LABRCNTIP (procalcitonin:4,lacticidven:4)  )No results found for this or any previous visit (from the past 240 hour(s)).    Radiology Studies: No results found.   Scheduled Meds: . aspirin EC  81 mg Oral QPM  . clonazePAM  0.5 mg Oral QHS  . cloNIDine  0.2 mg Oral QHS  . clopidogrel  75 mg Oral Daily  . enoxaparin (LOVENOX) injection  30 mg Subcutaneous Q24H  . feeding supplement (ENSURE ENLIVE)  237 mL Oral BID BM  . metoprolol succinate  25 mg Oral BID  . mometasone-formoterol  2 puff Inhalation BID  . OLANZapine  2.5 mg Oral QHS  . rosuvastatin  5 mg Oral q1800  . theophylline  150 mg Oral Daily  . vitamin B-12  1,000 mcg Oral Daily  . vitamin C  500 mg Oral Daily  .  vitamin E  400 Units Oral Daily   Continuous Infusions: . sodium chloride 75 mL/hr at 02/12/18 0700     LOS: 3 days   Time Spent in minutes   30 minutes  Xyler Terpening D.O. on 02/13/2018 at 12:24 PM  Between 7am to 7pm - Pager - [phone]  After 7pm go to [email or web address] - password [email or web address]  And look for the night coverage person covering for me after hours  [facility] Office  [phone]

## 2018-02-14 DIAGNOSIS — I6522 Occlusion and stenosis of left carotid artery: Secondary | ICD-10-CM | POA: Diagnosis not present

## 2018-02-14 DIAGNOSIS — F419 Anxiety disorder, unspecified: Secondary | ICD-10-CM | POA: Diagnosis not present

## 2018-02-14 DIAGNOSIS — E785 Hyperlipidemia, unspecified: Secondary | ICD-10-CM | POA: Diagnosis not present

## 2018-02-14 DIAGNOSIS — G9341 Metabolic encephalopathy: Secondary | ICD-10-CM | POA: Diagnosis not present

## 2018-02-14 DIAGNOSIS — R278 Other lack of coordination: Secondary | ICD-10-CM | POA: Diagnosis not present

## 2018-02-14 DIAGNOSIS — E43 Unspecified severe protein-calorie malnutrition: Secondary | ICD-10-CM | POA: Diagnosis not present

## 2018-02-14 DIAGNOSIS — G8194 Hemiplegia, unspecified affecting left nondominant side: Secondary | ICD-10-CM | POA: Diagnosis not present

## 2018-02-14 DIAGNOSIS — I639 Cerebral infarction, unspecified: Secondary | ICD-10-CM | POA: Diagnosis not present

## 2018-02-14 DIAGNOSIS — I10 Essential (primary) hypertension: Secondary | ICD-10-CM | POA: Diagnosis not present

## 2018-02-14 DIAGNOSIS — R41841 Cognitive communication deficit: Secondary | ICD-10-CM | POA: Diagnosis not present

## 2018-02-14 DIAGNOSIS — J449 Chronic obstructive pulmonary disease, unspecified: Secondary | ICD-10-CM | POA: Diagnosis not present

## 2018-02-14 DIAGNOSIS — R1312 Dysphagia, oropharyngeal phase: Secondary | ICD-10-CM | POA: Diagnosis not present

## 2018-02-14 DIAGNOSIS — M6281 Muscle weakness (generalized): Secondary | ICD-10-CM | POA: Diagnosis not present

## 2018-02-14 DIAGNOSIS — M6389 Disorders of muscle in diseases classified elsewhere, multiple sites: Secondary | ICD-10-CM | POA: Diagnosis not present

## 2018-02-14 DIAGNOSIS — R05 Cough: Secondary | ICD-10-CM | POA: Diagnosis not present

## 2018-02-14 DIAGNOSIS — I6602 Occlusion and stenosis of left middle cerebral artery: Secondary | ICD-10-CM | POA: Diagnosis not present

## 2018-02-14 DIAGNOSIS — R2681 Unsteadiness on feet: Secondary | ICD-10-CM | POA: Diagnosis not present

## 2018-02-14 DIAGNOSIS — N183 Chronic kidney disease, stage 3 (moderate): Secondary | ICD-10-CM | POA: Diagnosis not present

## 2018-02-14 DIAGNOSIS — I5043 Acute on chronic combined systolic (congestive) and diastolic (congestive) heart failure: Secondary | ICD-10-CM | POA: Diagnosis not present

## 2018-02-14 MED ORDER — CO-ENZYME Q-10 50 MG PO CAPS: 100.0000 mg | ORAL_CAPSULE | Freq: Every day | ORAL | Status: DC

## 2018-02-14 MED ORDER — ROSUVASTATIN CALCIUM 5 MG PO TABS: 5.0000 mg | ORAL_TABLET | Freq: Every day | ORAL | 0 refills | Status: DC

## 2018-02-14 MED ORDER — CLONAZEPAM 0.5 MG PO TABS: 0.5000 mg | ORAL_TABLET | Freq: Every day | ORAL | 0 refills | Status: DC

## 2018-02-14 MED ORDER — ENSURE ENLIVE PO LIQD: 237.0000 mL | Freq: Two times a day (BID) | ORAL | 0 refills | Status: DC

## 2018-02-14 NOTE — Care Management Important Message (Signed)
Important Message  Patient Details  Name: Erin Cox MRN: [id number] Date of Birth: Jan 02, 1921   Medicare Important Message Given:  Yes    Bayan Kushnir [address] 02/14/2018, 1:46 PM

## 2018-02-14 NOTE — Discharge Summary (Signed)
Physician Discharge Summary  Erin Cox YWV:[id number] DOB: [birth year] DOA: 02/10/2018  PCP: [name], MD  Admit date: 02/10/2018 Discharge date: 02/14/2018  Time spent: 45 minutes  Recommendations for Outpatient Follow-up:  Patient will be discharged to skilled nursing facility, continue physical and occupational therapy.  Patient will need to follow up with primary care provider within one week of discharge.  Follow up with neurology. Patient should continue medications as prescribed.  Patient should follow a heart healthy diet.   Discharge Diagnoses:  Acute CVA Acute metabolic encephalopathy Essential hypertension Hyperlipidemia Chronic kidney disease, Stage III COPD Anxiety  Severe protein calorie malnutrition  Discharge Condition: stable  Diet recommendation: heart healthy  Filed Weights   02/10/18 2357  Weight: 38.7 kg (85 lb 5.1 oz)    History of present illness:  On 02/10/2018 by Dr. Arie Sabina Capesis a 84 y.o.femalewith medical history significant ofhypertension, COPD, TIA, anxiety, coronary artery stenosis, CK-3, who presents with altered mental status, slurred speech, gait instability.  Per her granddaughter, patient was noted to be mildly confused yesterday morning at about 7 AM. She also has mild slurred speech and unsteady gait. Patient is forgetful than her baseline. No vision change or hearing loss. Patient moves all extremities normally, does not seem to have unilateral weakness in extremities per her granddaughter. Patient refused to come to the hospital yesterday. Patient does not have chest pain, shortness breath, cough, fever or chills. No nausea, vomiting, diarrhea or abdominal pain. Patient has increased urinary frequency, but no dysuria or burning on urination.  Hospital Course:  Acute CVA -Patient presented with unsteady gait as well as slurring speech -CT head showed no acute finding -MRI brain showed moderate sized acute  nonhemorrhagic pontine infarct.  Chronic appearing occlusion left ICA -MRA neck and head: Occluded left vertebral artery with reconstitution of the distal vertebral artery proximal to the vertebrobasilar junction.  Left ICA is occluded -Echocardiogram: EF of 85-46%, grade 1 diastolic dysfunction -LDL 96, hemoglobin A1c 5.2 -PT and OT recommended inpatient rehab -Currently on aspirin, Plavix -Patient has a statin intolerance -Neurology consulted and appreciated.  Discussed with Dr. Leonie Man, recommended starting Crestor 5 mg daily along with co-Q10 200 mg daily-will start with 100mg  daily -inpatient rehab consulted and appreciated, social worker consulted for possible SNF placement -patient's family opted for patient to be discharged to SNF  Acute metabolic encephalopathy -possibly has underlying dementia vs secondary to CVA -Currently stable  Essential hypertension -Clonidine and metoprolol continued -Plendil, Cozaar held due to CVA, allowing for permissive hypertension  Hyperlipidemia -Panel shows a total cholesterol 218, HDL 102, LDL 96, triglycerides 101 -Patient has had a statin intolerance in the past.  Neurology recommended starting Crestor 5 mg daily along with co-Q10  Chronic kidney disease, Stage III -Stable  COPD -Stable, continue Dulera, theophylline nebs  Anxiety  -Continue klonopin  Severe protein calorie malnutrition -nutrition consult , continue feeding supplements  Consultants Neurology Inpatient rehab  Procedures  Echocardiogram  Code status: DNR  Discharge Exam: Vitals:   02/14/18 1227 02/14/18 1531  BP: (!) 177/89 (!) 142/82  Pulse: 80 75  Resp: 16 16  Temp: 98.4 F (36.9 C) 98.5 F (36.9 C)  SpO2: 99% 97%     General: Well developed, elderly, thin, NAD  HEENT: NCAT,mucous membranes moist.  Neck: Supple  Cardiovascular: S1 S2 auscultated, RRR, no murmur  Respiratory: Clear to auscultation bilaterally with equal chest  rise  Abdomen: Soft, nontender, nondistended, + bowel sounds  Extremities: warm dry  without cyanosis clubbing or edema  Neuro: AAOx3, nonfocal  Psych: Normal affect and demeanor with intact judgement and insight  Discharge Instructions Discharge Instructions    Ambulatory referral to Neurology   Complete by:  As directed    Follow up with stroke clinic NP (Jessica Vanschaick or Cecille Rubin, if both not available, consider Zachery Dauer, or A[place]) at [facility] in about 4 weeks. Thanks.   Discharge instructions   Complete by:  As directed    Patient will be discharged to skilled nursing facility, continue physical and occupational therapy.  Patient will need to follow up with primary care provider within one week of discharge.  Follow up with neurology. Patient should continue medications as prescribed.  Patient should follow a heart healthy diet.     Allergies as of 02/14/2018      Reactions   Lipitor [atorvastatin] Other (See Comments)   Muscle contractions and fuzzy feeling   Other Hives, Swelling   Reaction to walnuts   Penicillins Hives   Has patient had a PCN reaction causing immediate rash, facial/tongue/throat swelling, SOB or lightheadedness with hypotension: Yes Has patient had a PCN reaction causing severe rash involving mucus membranes or skin necrosis: No Has patient had a PCN reaction that required hospitalization: No Has patient had a PCN reaction occurring within the last 10 years: Yes If all of the above answers are "NO", then may proceed with Cephalosporin use.   Statins Other (See Comments)   Muscle contractions and fuzzy feeling   Sulfa Antibiotics Other (See Comments)   Made asthma worse   Iodine Rash      Medication List    TAKE these medications   aspirin EC 81 MG tablet Take 81 mg by mouth every evening.   budesonide-formoterol 160-4.5 MCG/ACT inhaler Commonly known as:  SYMBICORT Inhale 2 puffs into the lungs 2 (two) times daily.   carbamazepine  100 MG chewable tablet Commonly known as:  TEGRETOL Chew 100 mg by mouth 3 (three) times daily as needed (for headaches).   CITRACAL PO Take 2 tablets by mouth daily.   clonazePAM 0.5 MG tablet Commonly known as:  KLONOPIN Take 1 tablet (0.5 mg total) by mouth at bedtime. For anxiety   cloNIDine 0.2 MG tablet Commonly known as:  CATAPRES Take 0.2 mg by mouth at bedtime.   clopidogrel 75 MG tablet Commonly known as:  PLAVIX Take 75 mg by mouth daily.   co-enzyme Q-10 50 MG capsule Take 2 capsules (100 mg total) by mouth daily.   feeding supplement (ENSURE ENLIVE) Liqd Take 237 mLs by mouth 2 (two) times daily between meals.   felodipine 10 MG 24 hr tablet Commonly known as:  PLENDIL Take 5 mg by mouth daily.   losartan 100 MG tablet Commonly known as:  COZAAR Take 100 mg by mouth daily.   metoprolol succinate 25 MG 24 hr tablet Commonly known as:  TOPROL-XL Take 25 mg by mouth 2 (two) times daily.   OLANZapine 5 MG tablet Commonly known as:  ZYPREXA Take 2.5 mg by mouth at bedtime.   potassium gluconate 595 (99 K) MG Tabs tablet Take 595 mg by mouth daily.   rosuvastatin 5 MG tablet Commonly known as:  CRESTOR Take 1 tablet (5 mg total) by mouth daily at 6 PM.   theophylline 300 MG 12 hr tablet Commonly known as:  THEODUR Take 150 mg by mouth daily.   vitamin B-12 1000 MCG tablet Commonly known as:  CYANOCOBALAMIN Take 1,000 mcg  by mouth daily.   vitamin C 1000 MG tablet Take 500 mg by mouth daily.   vitamin E 400 UNIT capsule Take 400 Units by mouth daily.      Allergies  Allergen Reactions  . Lipitor [Atorvastatin] Other (See Comments)    Muscle contractions and fuzzy feeling  . Other Hives and Swelling    Reaction to walnuts  . Penicillins Hives    Has patient had a PCN reaction causing immediate rash, facial/tongue/throat swelling, SOB or lightheadedness with hypotension: Yes Has patient had a PCN reaction causing severe rash involving mucus  membranes or skin necrosis: No Has patient had a PCN reaction that required hospitalization: No Has patient had a PCN reaction occurring within the last 10 years: Yes If all of the above answers are "NO", then may proceed with Cephalosporin use.  . Statins Other (See Comments)    Muscle contractions and fuzzy feeling  . Sulfa Antibiotics Other (See Comments)    Made asthma worse  . Iodine Rash   Follow-up Information    [facility]. Schedule an appointment as soon as possible for a visit in 4 week(s).   Specialty:  Radiology Contact information: [address] [place] [phone]       [name], MD. Schedule an appointment as soon as possible for a visit in 1 week(s).   Specialty:  Internal Medicine Why:  Hosptial follow up Contact information: [address] [place] [address] [phone]            The results of significant diagnostics from this hospitalization (including imaging, microbiology, ancillary and laboratory) are listed below for reference.    Significant Diagnostic Studies: Dg Chest 2 View  Result Date: 02/10/2018 CLINICAL DATA:  COPD and wheezing.  Altered mental status. EXAM: CHEST - 2 VIEW COMPARISON:  Chest x-ray dated February 03, 2013. FINDINGS: The heart size and mediastinal contours are within normal limits. Normal pulmonary vascularity. The lungs remain hyperexpanded with emphysematous changes. Scarring in the right upper lobe. No focal consolidation, pleural effusion, or pneumothorax. Mild to moderate compression deformity of a midthoracic vertebral body is unchanged. Old healed fracture deformity of the left proximal humerus. No acute osseous abnormality. IMPRESSION: 1. COPD.  No active cardiopulmonary disease. Electronically Signed   By: Titus Dubin M.D.   On: 02/10/2018 13:49   Ct Head Wo Contrast  Result Date: 02/10/2018 CLINICAL DATA:  Confusion since  yesterday. EXAM: CT HEAD WITHOUT CONTRAST TECHNIQUE: Contiguous axial images were obtained from the base of the skull through the vertex without intravenous contrast. COMPARISON:  11/05/2009 FINDINGS: Brain: Advanced generalized cerebral and cerebellar atrophy. Extensive chronic small-vessel ischemic changes throughout the pons in the cerebral hemispheric white matter. Old small lacunar infarctions of the basal ganglia and thalami. No sign of acute infarction, mass lesion, hemorrhage, hydrocephalus or extra-axial collection. Vascular: There is atherosclerotic calcification of the major vessels at the base of the brain. Skull: Normal Sinuses/Orbits: Clear/normal Other: None IMPRESSION: No acute finding by CT. Advanced atrophy and chronic small-vessel ischemic changes throughout the brain, progressive since 2011. Electronically Signed   By: Nelson Chimes M.D.   On: 02/10/2018 11:35   Mr Jodene Nam Neck Wo Contrast  Result Date: 02/11/2018 CLINICAL DATA:  Acute/subacute nonhemorrhagic pontine infarct. EXAM: MRA NECK WITHOUT CONTRAST MRA HEAD WITHOUT CONTRAST TECHNIQUE: Multiplanar and multiecho pulse sequences of the neck were obtained without intravenous contrast. Angiographic images of the neck were obtained using MRA  technique without intravenous contast.; Angiographic images of the Circle of Willis were obtained using MRA technique without intravenous contrast. COMPARISON:  None. FINDINGS: MRA NECK FINDINGS The left vertebral artery is occluded proximally. Right vertebral artery appears to be the dominant vessel. There is no focal stenosis in the right vertebral artery. The right common carotid artery is within normal limits. Mild narrowing approximately 60% is present in the proximal left internal carotid artery with associated atherosclerotic plaque. There is tortuosity of the more distal cervical right ICA without a is tandem stenosis. The left common carotid artery is within normal limits. The left internal carotid  artery is occluded just beyond the bifurcation without significant reconstitution in the neck. MRA HEAD FINDINGS Flow is present in the left internal carotid artery at the supraclinoid segment. There is atherosclerotic irregularity within the cavernous right ICA without a significant stenosis. The A1 segments are patent bilaterally. The anterior communicating artery is patent. M1 segments are normal. MCA bifurcations are intact. There is attenuation of MCA branch vessels bilaterally, left greater than right. No significant proximal stenosis or occlusion is present. ACA vessels are within normal limits. The right vertebral artery is the dominant vessel. There is some flow in the distal left vertebral artery with moderate stenosis beyond the PICA. Both PICA origins are visualized and normal. Basilar artery is normal. The posterior cerebral arteries originate from the basilar tip. Moderate branch vessel stenosis is present bilaterally. IMPRESSION: 1. Occluded left vertebral artery with reconstitution of the distal vertebral artery proximal to the vertebrobasilar junction. 2. Dominant right vertebral artery without focal stenosis. 3. Left internal carotid artery is occluded at the bifurcation with patent anterior communicating artery and normal appearance of the A1 and M1 segments bilaterally. 4. Moderate diffuse medium and small vessel disease. Electronically Signed   By: San Morelle M.D.   On: 02/11/2018 09:16   Mr Brain Wo Contrast  Result Date: 02/10/2018 CLINICAL DATA:  Stroke-like symptoms, unable to walk on her own, with confusion. Symptoms since yesterday. EXAM: MRI HEAD WITHOUT CONTRAST TECHNIQUE: Multiplanar, multiecho pulse sequences of the brain and surrounding structures were obtained without intravenous contrast. COMPARISON:  CT head earlier today FINDINGS: Brain: Moderate-sized acute pontine infarct, RIGHT paramedian location. Corresponding low ADC. No hemorrhage. Atrophy with hydrocephalus  ex vacuo. Mild small vessel disease. No intra-axial mass lesion or extra-axial fluid. No midline shift. Vascular: Dolichoectasia, dolichoectasia of the intracranial circulation. RIGHT internal carotid, basilar, and both vertebrals widely patent. There is a chronic appearing occlusion of the LEFT ICA in its skull base segment. This appears to be reconstituted by collateral flow intracranially. Skull and upper cervical spine: Normal marrow signal. Sinuses/Orbits: BILATERAL cataract extraction. No significant sinus fluid. Other: None. IMPRESSION: Moderate-sized acute nonhemorrhagic pontine infarct. Wide patency of the adjacent basilar artery suggests small-vessel insult. Advanced atrophy. Chronic appearing occlusion LEFT ICA. Electronically Signed   By: Staci Righter M.D.   On: 02/10/2018 20:59   Mr Jodene Nam Head Wo Contrast  Result Date: 02/11/2018 CLINICAL DATA:  Acute/subacute nonhemorrhagic pontine infarct. EXAM: MRA NECK WITHOUT CONTRAST MRA HEAD WITHOUT CONTRAST TECHNIQUE: Multiplanar and multiecho pulse sequences of the neck were obtained without intravenous contrast. Angiographic images of the neck were obtained using MRA technique without intravenous contast.; Angiographic images of the Circle of Willis were obtained using MRA technique without intravenous contrast. COMPARISON:  None. FINDINGS: MRA NECK FINDINGS The left vertebral artery is occluded proximally. Right vertebral artery appears to be the dominant vessel. There is no focal stenosis in the  right vertebral artery. The right common carotid artery is within normal limits. Mild narrowing approximately 60% is present in the proximal left internal carotid artery with associated atherosclerotic plaque. There is tortuosity of the more distal cervical right ICA without a is tandem stenosis. The left common carotid artery is within normal limits. The left internal carotid artery is occluded just beyond the bifurcation without significant reconstitution in the  neck. MRA HEAD FINDINGS Flow is present in the left internal carotid artery at the supraclinoid segment. There is atherosclerotic irregularity within the cavernous right ICA without a significant stenosis. The A1 segments are patent bilaterally. The anterior communicating artery is patent. M1 segments are normal. MCA bifurcations are intact. There is attenuation of MCA branch vessels bilaterally, left greater than right. No significant proximal stenosis or occlusion is present. ACA vessels are within normal limits. The right vertebral artery is the dominant vessel. There is some flow in the distal left vertebral artery with moderate stenosis beyond the PICA. Both PICA origins are visualized and normal. Basilar artery is normal. The posterior cerebral arteries originate from the basilar tip. Moderate branch vessel stenosis is present bilaterally. IMPRESSION: 1. Occluded left vertebral artery with reconstitution of the distal vertebral artery proximal to the vertebrobasilar junction. 2. Dominant right vertebral artery without focal stenosis. 3. Left internal carotid artery is occluded at the bifurcation with patent anterior communicating artery and normal appearance of the A1 and M1 segments bilaterally. 4. Moderate diffuse medium and small vessel disease. Electronically Signed   By: San Morelle M.D.   On: 02/11/2018 09:16    Microbiology: No results found for this or any previous visit (from the past 240 hour(s)).   Labs: Basic Metabolic Panel: Recent Labs  Lab 02/10/18 1052 02/10/18 1105  NA 140 140  K 4.1 4.1  CL 102 102  CO2 27  --   GLUCOSE 109* 105*  BUN 28* 29*  CREATININE 1.24* 1.20*  CALCIUM 10.2  --    Liver Function Tests: Recent Labs  Lab 02/10/18 1052  AST 24  ALT 15  ALKPHOS 102  BILITOT 0.6  PROT 7.1  ALBUMIN 4.1   No results for input(s): LIPASE, AMYLASE in the last 168 hours. No results for input(s): AMMONIA in the last 168 hours. CBC: Recent Labs  Lab  02/10/18 1052 02/10/18 1105  WBC 6.2  --   NEUTROABS 4.5  --   HGB 12.9 13.6  HCT 40.8 40.0  MCV 95.8  --   PLT 233  --    Cardiac Enzymes: No results for input(s): CKTOTAL, CKMB, CKMBINDEX, TROPONINI in the last 168 hours. BNP: BNP (last 3 results) No results for input(s): BNP in the last 8760 hours.  ProBNP (last 3 results) No results for input(s): PROBNP in the last 8760 hours.  CBG: Recent Labs  Lab 02/10/18 1042  GLUCAP 113*       Signed:  Javarion Douty  [facility] 02/14/2018, 4:12 PM

## 2018-02-14 NOTE — NC FL2 (Signed)
[place] LEVEL OF CARE SCREENING TOOL     IDENTIFICATION  Patient Name: Erin Cox Birthdate: [birth year] Sex: female Admission Date (Current Location): 02/10/2018  [facility] and [place] Number:  Herbalist and Address:  [facility] [place], [place] [address] [place], [place] [address]      Provider Number: [id number]  Attending Physician Name and Address:  Cristal Ford, DO  Relative Name and Phone Number:       Current Level of Care: Hospital Recommended Level of Care: [place] Prior Approval Number:    Date Approved/Denied:   PASRR Number: [id number]  Discharge Plan: SNF    Current Diagnoses: Patient Active Problem List   Diagnosis Date Noted  . Protein-calorie malnutrition, severe 02/13/2018  . Hyperlipidemia   . Carotid occlusion, left   . Ischemic stroke ([place]) 02/10/2018  . Acute metabolic encephalopathy 85/27/7824  . CKD (chronic kidney disease), stage III ([place]) 02/10/2018  . COPD (chronic obstructive pulmonary disease) ([place]) 02/10/2018  . Anxiety 02/10/2018  . Protein-calorie malnutrition, moderate ([place]) 02/10/2018  . Essential hypertension 02/10/2018  . TIA (transient ischemic attack)   . Carotid artery obstruction     Orientation RESPIRATION BLADDER Height & Weight     Self, Place  Normal Continent Weight: 85 lb 5.1 oz (38.7 kg) Height:  4\' 2"  (127 cm)  BEHAVIORAL SYMPTOMS/MOOD NEUROLOGICAL BOWEL NUTRITION STATUS      Continent Diet(Heart healthy, thin liquids)  AMBULATORY STATUS COMMUNICATION OF NEEDS Skin   Limited Assist Verbally Skin abrasions(Open wound/incision right arm, foam dressing, change PRN.)                       Personal Care Assistance Level of Assistance  Bathing, Feeding, Dressing Bathing Assistance: Limited assistance Feeding assistance: Independent Dressing Assistance: Limited assistance     Functional Limitations Info  Sight, Hearing, Speech Sight Info:  Adequate Hearing Info: Adequate Speech Info: Adequate    SPECIAL CARE FACTORS FREQUENCY  PT (By licensed PT), OT (By licensed OT)     PT Frequency: 3x OT Frequency: 3x            Contractures Contractures Info: Not present    Additional Factors Info  Code Status, Allergies Code Status Info: DNR Allergies Info: Lipitor Atorvastatin, Other, Penicillins, Statins, Sulfa Antibiotics, Iodine           Current Medications (02/14/2018):  This is the current hospital active medication list Current Facility-Administered Medications  Medication Dose Route Frequency Provider Last Rate Last Dose  . acetaminophen (TYLENOL) tablet 650 mg  650 mg Oral Q4H PRN Ivor Costa, MD   650 mg at 02/13/18 2233   Or  . acetaminophen (TYLENOL) solution 650 mg  650 mg Per Tube Q4H PRN Ivor Costa, MD       Or  . acetaminophen (TYLENOL) suppository 650 mg  650 mg Rectal Q4H PRN Ivor Costa, MD      . albuterol (PROVENTIL) (2.5 MG/3ML) 0.083% nebulizer solution 2.5 mg  2.5 mg Nebulization Q4H PRN Ivor Costa, MD      . aspirin EC tablet 81 mg  81 mg Oral QPM Ivor Costa, MD   81 mg at 02/13/18 1730  . carbamazepine (TEGRETOL) chewable tablet 100 mg  100 mg Oral TID PRN Ivor Costa, MD      . clonazePAM Bobbye [place]) tablet 0.5 mg  0.5 mg Oral QHS Ivor Costa, MD   0.5 mg at 02/13/18 2233  . cloNIDine (  CATAPRES) tablet 0.2 mg  0.2 mg Oral QHS Ivor Costa, MD   0.2 mg at 02/13/18 2232  . clopidogrel (PLAVIX) tablet 75 mg  75 mg Oral Daily Ivor Costa, MD   75 mg at 02/14/18 0939  . enoxaparin (LOVENOX) injection 30 mg  30 mg Subcutaneous Q24H Ivor Costa, MD   30 mg at 02/14/18 0941  . feeding supplement (ENSURE ENLIVE) (ENSURE ENLIVE) liquid 237 mL  237 mL Oral BID BM Cristal Ford, DO   237 mL at 02/13/18 0802  . hydrALAZINE (APRESOLINE) injection 5 mg  5 mg Intravenous Q2H PRN Ivor Costa, MD      . metoprolol succinate (TOPROL-XL) 24 hr tablet 25 mg  25 mg Oral BID Ivor Costa, MD   25 mg at 02/14/18 0940  .  mometasone-formoterol (DULERA) 200-5 MCG/ACT inhaler 2 puff  2 puff Inhalation BID Ivor Costa, MD   2 puff at 02/14/18 0932  . OLANZapine (ZYPREXA) tablet 2.5 mg  2.5 mg Oral QHS Ivor Costa, MD   2.5 mg at 02/13/18 2233  . rosuvastatin (CRESTOR) tablet 5 mg  5 mg Oral q1800 Cristal Ford, DO   5 mg at 02/13/18 1730  . senna-docusate (Senokot-S) tablet 1 tablet  1 tablet Oral QHS PRN Ivor Costa, MD      . theophylline (THEODUR) 12 hr tablet 150 mg  150 mg Oral Daily Ivor Costa, MD   150 mg at 02/14/18 0939  . vitamin B-12 (CYANOCOBALAMIN) tablet 1,000 mcg  1,000 mcg Oral Daily Ivor Costa, MD   1,000 mcg at 02/14/18 0940  . vitamin C (ASCORBIC ACID) tablet 500 mg  500 mg Oral Daily Ivor Costa, MD   500 mg at 02/14/18 0940  . vitamin E capsule 400 Units  400 Units Oral Daily Ivor Costa, MD   400 Units at 02/14/18 4801     Discharge Medications: Please see discharge summary for a list of discharge medications.  Relevant Imaging Results:  Relevant Lab Results:   Additional Information SSN: [id number]  Eileen Stanford, LCSW

## 2018-02-14 NOTE — Consult Note (Signed)
Physical Medicine and Rehabilitation Consult Reason for Consult: Altered mental status with slurred speech and gait instability Referring Physician: [facility]   HPI: Erin Cox is a 84 y.o. right-handed female with history of hypertension, COPD, TIA maintained on aspirin and Plavix.  Per chart review patient lives alone.  Independent with assistive device.  Independent with ADLs has Meals on Wheels.  She has family in the area that check on her as needed.  Presented 02/10/2018 with altered mental status and slurred speech.  Cranial CT scan with negative acute findings.  Patient did not receive TPA.  MRI shows moderate sized acute nonhemorrhagic pontine infarction.  MRA with chronic appearing occlusion left ICA as well as occluded left vertebral artery.  Echocardiogram with ejection fraction of 20% grade 1 diastolic dysfunction.  Neurology consulted presently remains on aspirin and Plavix for CVA prophylaxis.  Subcutaneous Lovenox for DVT prophylaxis.  Tolerating a regular diet.  Physical and occupational therapy evaluations completed with recommendations of physical medicine rehab consult.   Review of Systems  Constitutional: Negative for chills and fever.  HENT: Positive for hearing loss.   Eyes: Negative for blurred vision and double vision.  Respiratory: Positive for cough and shortness of breath.   Cardiovascular: Positive for palpitations. Negative for chest pain and leg swelling.  Gastrointestinal: Positive for constipation. Negative for nausea and vomiting.  Genitourinary: Negative for dysuria, flank pain and hematuria.  Musculoskeletal: Positive for joint pain and myalgias.  Psychiatric/Behavioral:       Anxiety  All other systems reviewed and are negative.  Past Medical History:  Diagnosis Date  . Asthma   . Carotid artery obstruction   . COPD (chronic obstructive pulmonary disease) ([place])   . Hypertension   . TIA (transient ischemic attack)    Past Surgical History:    Procedure Laterality Date  . APPENDECTOMY     Family History  Problem Relation Age of Onset  . Hypertension Mother   . Heart attack Father    Social History:  reports that she has never smoked. She has never used smokeless tobacco. She reports that she does not drink alcohol or use drugs. Allergies:  Allergies  Allergen Reactions  . Lipitor [Atorvastatin] Other (See Comments)    Muscle contractions and fuzzy feeling  . Other Hives and Swelling    Reaction to walnuts  . Penicillins Hives    Has patient had a PCN reaction causing immediate rash, facial/tongue/throat swelling, SOB or lightheadedness with hypotension: Yes Has patient had a PCN reaction causing severe rash involving mucus membranes or skin necrosis: No Has patient had a PCN reaction that required hospitalization: No Has patient had a PCN reaction occurring within the last 10 years: Yes If all of the above answers are "NO", then may proceed with Cephalosporin use.  . Statins Other (See Comments)    Muscle contractions and fuzzy feeling  . Sulfa Antibiotics Other (See Comments)    Made asthma worse  . Iodine Rash   Medications Prior to Admission  Medication Sig Dispense Refill  . Ascorbic Acid (VITAMIN C) 1000 MG tablet Take 500 mg by mouth daily.     Marland Kitchen aspirin EC 81 MG tablet Take 81 mg by mouth every evening.    . budesonide-formoterol (SYMBICORT) 160-4.5 MCG/ACT inhaler Inhale 2 puffs into the lungs 2 (two) times daily.    . Calcium Citrate (CITRACAL PO) Take 2 tablets by mouth daily.    . carbamazepine (TEGRETOL) 100 MG chewable tablet Chew  100 mg by mouth 3 (three) times daily as needed (for headaches).     . clonazePAM (KLONOPIN) 0.5 MG tablet Take 0.5 mg by mouth at bedtime. For anxiety    . cloNIDine (CATAPRES) 0.2 MG tablet Take 0.2 mg by mouth at bedtime.    . clopidogrel (PLAVIX) 75 MG tablet Take 75 mg by mouth daily.    . felodipine (PLENDIL) 10 MG 24 hr tablet Take 5 mg by mouth daily.    Marland Kitchen losartan  (COZAAR) 100 MG tablet Take 100 mg by mouth daily.    . metoprolol succinate (TOPROL-XL) 25 MG 24 hr tablet Take 25 mg by mouth 2 (two) times daily.    Marland Kitchen OLANZapine (ZYPREXA) 5 MG tablet Take 2.5 mg by mouth at bedtime.    . potassium gluconate 595 MG TABS Take 595 mg by mouth daily.    . theophylline (THEODUR) 300 MG 12 hr tablet Take 150 mg by mouth daily.    . vitamin B-12 (CYANOCOBALAMIN) 1000 MCG tablet Take 1,000 mcg by mouth daily.    . vitamin E 400 UNIT capsule Take 400 Units by mouth daily.      Home: Home Living Family/patient expects to be discharged to:: Private residence Living Arrangements: Alone, Other relatives Available Help at Discharge: Family, Available PRN/intermittently Type of Home: House Home Access: Stairs to enter Entrance Stairs-Number of Steps: 0 Home Layout: One level Bathroom Shower/Tub: Tub/shower unit, Multimedia programmer: Standard Home Equipment: Financial controller: Reacher  Lives With: Alone  Functional History: Prior Function Level of Independence: Independent with assistive device(s), Needs assistance Gait / Transfers Assistance Needed: uses no AD for mobility, bu states that she reaches out for wall and furniture ADL's / Homemaking Assistance Needed: pt was independent with ADLs, has Meals on Wheels Comments: pt reports that family lives nearby Functional Status:  Mobility: Bed Mobility Overal bed mobility: Needs Assistance Bed Mobility: Supine to Sit, Sit to Supine Supine to sit: Min assist Sit to supine: Min assist General bed mobility comments: assist to elevate trunk into sitting; light MinA to guide LEs onto bed when returning to supine  Transfers Overall transfer level: Needs assistance Equipment used: 1 person hand held assist, 2 person hand held assist Transfers: Sit to/from Stand, [employer] Transfers Sit to Stand: Mod assist Stand pivot transfers: Mod assist, +2 safety/equipment General transfer  comment: ModA for sit<>stand from EOB and BSC; ModA +2 (for safety) for stand pivot to/from [facility], verbal cues for hand placement and assist to guide hips during transfer completion Ambulation/Gait Ambulation/Gait assistance: Min assist Ambulation Distance (Feet): 18 Feet Assistive device: 1 person hand held assist Gait Pattern/deviations: Step-to pattern, Decreased stride length, Shuffle, Trunk flexed, Narrow base of support General Gait Details: slow guarded and unsteady with ambulation, posterior bias initially and then difficulty maintaining balance when turning toward chair. Multi modal cues for safety Gait velocity: Patient very slow and guard Gait velocity interpretation: <1.31 ft/sec, indicative of household ambulator    ADL: ADL Overall ADL's : Needs assistance/impaired Grooming: Wash/dry hands, Wash/dry face, Minimal assistance, Standing Upper Body Bathing: Minimal assistance Lower Body Bathing: Maximal assistance Upper Body Dressing : Minimal assistance Lower Body Dressing: Maximal assistance Toilet Transfer: Moderate assistance, Stand-pivot, BSC, Cueing for sequencing, Cueing for safety Toileting- Clothing Manipulation and Hygiene: Maximal assistance, Sit to/from stand, +2 for physical assistance, +2 for safety/equipment Toileting - Clothing Manipulation Details (indicate cue type and reason): ModA for standing balance while NT assists with pericare after voiding bladder,  assist for gown and brief management Functional mobility during ADLs: Moderate assistance, +2 for physical assistance, +2 for safety/equipment General ADL Comments: Pt requiring ModA+2 for stand pivot transfer EOB<>BSC, requires increased time and multimodal cues, pt appears fearful of falling with mobility requiring increased assist  Cognition: Cognition Overall Cognitive Status: Impaired/Different from baseline Arousal/Alertness: Awake/alert Orientation Level: Oriented to person, Oriented to place,  Disoriented to time, Disoriented to situation Attention: Sustained Sustained Attention: Impaired Sustained Attention Impairment: Verbal basic Memory: Impaired Memory Impairment: Storage deficit Awareness: Impaired Awareness Impairment: Intellectual impairment Problem Solving: Impaired Problem Solving Impairment: Verbal basic Executive Function: Self Monitoring, Organizing Organizing: Impaired Organizing Impairment: Verbal basic Self Monitoring: Impaired Self Monitoring Impairment: Verbal basic Safety/Judgment: Impaired Cognition Arousal/Alertness: Awake/alert Behavior During Therapy: WFL for tasks assessed/performed Overall Cognitive Status: Impaired/Different from baseline Area of Impairment: Attention, Safety/judgement, Problem solving Safety/Judgement: Decreased awareness of safety, Decreased awareness of deficits Problem Solving: Difficulty sequencing, Requires verbal cues, Requires tactile cues  Blood pressure (!) 141/75, pulse 75, temperature 98.3 F (36.8 C), temperature source Oral, resp. rate 16, height 4\' 2"  (1.27 m), weight 38.7 kg (85 lb 5.1 oz), SpO2 99 %. Physical Exam  Vitals reviewed. Constitutional:  84 year old right-handed frail female  HENT:  Head: Normocephalic.  Eyes: EOM are normal.  Neck: Normal range of motion. Neck supple. No thyromegaly present.  Cardiovascular:  Cardiac rate controlled  Respiratory: Effort normal and breath sounds normal. No respiratory distress.  GI: Soft. Bowel sounds are normal. She exhibits no distension.  Neurological: She is alert.  Makes good eye contact with examiner.  She provides her name, age and date of birth.  Follows simple commands.  Fair awareness of deficits.  Left upper extremity grossly 4 out of 5 proximal distal.  Left lower extremity 3+ to 4 out of 5 proximal to distal.  Right upper extremity right lower extremity are 4+ out of 5 proximal to distal.  No gross sensory changes.  Mild left pronator drift.  Skin:    Patient with multiple skin tears on her upper extremities.  Significant bruising bilateral upper extremities left greater than right.  No associated pain.  Psychiatric: She has a normal mood and affect. Her behavior is normal.    No results found for this or any previous visit (from the past 24 hour(s)). No results found.    Assessment/Plan: Diagnosis: right paramedian pontine infarct 1. Does the need for close, 24 hr/day medical supervision in concert with the patient's rehab needs make it unreasonable for this patient to be served in a less intensive setting? Yes 2. Co-Morbidities requiring supervision/potential complications: CKD, COPD, HTN, poor nutrition 3. Due to bladder management, bowel management, safety, skin/wound care, disease management, medication administration, pain management and patient education, does the patient require 24 hr/day rehab nursing? Yes 4. Does the patient require coordinated care of a physician, rehab nurse, PT (1-2 hrs/day, 5 days/week), OT (1-2 hrs/day, 5 days/week) and SLP (1-2 hrs/day, 5 days/week) to address physical and functional deficits in the context of the above medical diagnosis(es)? Yes Addressing deficits in the following areas: balance, endurance, locomotion, strength, transferring, bowel/bladder control, bathing, dressing, feeding, grooming, toileting, cognition, speech and psychosocial support 5. Can the patient actively participate in an intensive therapy program of at least 3 hrs of therapy per day at least 5 days per week? Yes 6. The potential for patient to make measurable gains while on inpatient rehab is excellent 7. Anticipated functional outcomes upon discharge from inpatient rehab are supervision and min assist  with PT, supervision and min assist with OT, modified independent and supervision with SLP. 8. Estimated rehab length of stay to reach the above functional goals is: 13-19 days 9. Anticipated D/C setting: Home 10. Anticipated  post D/C treatments: HH therapy and Outpatient therapy 11. Overall Rehab/Functional Prognosis: excellent  RECOMMENDATIONS: This patient's condition is appropriate for continued rehabilitative care in the following setting: CIR Patient has agreed to participate in recommended program. Yes Note that insurance prior authorization may be required for reimbursement for recommended care.  Comment: Rehab Admissions Coordinator to follow up.  Thanks,  Meredith Staggers, MD, Mellody Drown  I have personally performed a face to face diagnostic evaluation of this patient. Additionally, I have reviewed and concur with the physician assistant's documentation above.    Lavon Paganini Angiulli, PA-C 02/14/2018

## 2018-02-14 NOTE — Progress Notes (Signed)
  Speech Language Pathology Treatment: Cognitive-Linquistic  Patient Details Name: Erin Cox MRN: [id number] DOB: 11/25/1920 Today's Date: 02/14/2018 Time: 0962-8366 SLP Time Calculation (min) (ACUTE ONLY): 36 min  Assessment / Plan / Recommendation Clinical Impression  Pt is more oriented today, needing Min cues for temporal orientation only. SLP provided Min cues for simple verbal sequencing task and Mod cues for recall of daily events. Pt does have good intellectual awareness of physical limitations, and even shows some anticipatory awareness by stating that she knows she is not safe to return home. Several of pt's grandchildren are present (a grandson, his wife, and another grandson) and they had several questions and concerns about f/u therapy. Questions were answered as able, although I also referred them to their admissions coordinator. Continue to recommend SLP f/u and 24/7 supervision - likely best provided at SNF as family cannot provide 24/7 supervision.   HPI HPI: Erin Cox is a 84 y.o. female with a history of hypertension, TIA, COPD, and asthma who presents with 2 days of unsteady gait, slurred speech.  Lives independently PTA. Never before seen by SLP. MRI (4/18) revealed a moderate-sized acute nonhemorrhagic pontine infarct. Wide patency of the adjacent basilar artery suggests small-vessel insult, advanced atrophy, and chronic appearing occlusion left ICA.      SLP Plan  Continue with current plan of care       Recommendations                   Follow up Recommendations: Skilled Nursing facility SLP Visit Diagnosis: Cognitive communication deficit (Q94.765) Plan: Continue with current plan of care       GO                Germain Osgood 02/14/2018, 3:12 PM  Germain Osgood, M.A. CCC-SLP [phone]

## 2018-02-14 NOTE — Clinical Social Work Note (Signed)
Clinical Social Worker facilitated patient discharge including contacting patient family and facility to confirm patient discharge plans.  Clinical information faxed to facility and family agreeable with plan.  CSW arranged ambulance transport via [facility] to [facility] .  RN to call [phone] for report prior to discharge.   Clinical Social Worker will sign off for now as social work intervention is no longer needed. Please consult [place] again if new need arises.  [place], [place]

## 2018-02-14 NOTE — Clinical Social Work Placement (Signed)
   CLINICAL SOCIAL WORK PLACEMENT  NOTE  Date:  02/14/2018  Patient Details  Name: Erin Cox MRN: [id number] Date of Birth: 02-Jun-1921  Clinical Social Work is seeking post-discharge placement for this patient at the [place] level of care (*CSW will initial, date and re-position this form in  chart as items are completed):      Patient/family provided with [place] Work Department's list of facilities offering this level of care within the geographic area requested by the patient (or if unable, by the patient's family).  Yes   Patient/family informed of their freedom to choose among providers that offer the needed level of care, that participate in Medicare, Medicaid or managed care program needed by the patient, have an available bed and are willing to accept the patient.      Patient/family informed of [facility]'s ownership interest in [facility] and [facility], as well as of the fact that they are under no obligation to receive care at these facilities.  PASRR submitted to EDS on       PASRR number received on 02/14/18     Existing PASRR number confirmed on 02/14/18     FL2 transmitted to all facilities in geographic area requested by pt/family on 02/14/18     FL2 transmitted to all facilities within larger geographic area on       Patient informed that his/her managed care company has contracts with or will negotiate with certain facilities, including the following:        Yes   Patient/family informed of bed offers received.  Patient chooses bed at [place], [place]     Physician recommends and patient chooses bed at      Patient to be transferred to [facility] on 02/14/18.  Patient to be transferred to facility by [facility]     Patient family notified on 02/14/18 of transfer.  Name of family member notified:  Grandaughter in Sports coach and grandson at bedside.     PHYSICIAN       Additional Comment:     _______________________________________________ Eileen Stanford, LCSW 02/14/2018, 4:23 PM

## 2018-02-14 NOTE — Progress Notes (Signed)
Pt's grandson has contacted me to request SNF placement. I will alert SW. We will sign off at this time. [phone]

## 2018-02-14 NOTE — Plan of Care (Signed)
Pts po intake continues to improve

## 2018-02-14 NOTE — Clinical Social Work Note (Signed)
Clinical Social Work Assessment  Patient Details  Name: Erin Cox MRN: [id number] Date of Birth: 1921-02-08  Date of referral:  02/14/18               Reason for consult:  Facility Placement                Permission sought to share information with:  Family Supports Permission granted to share information::     Name::     Grandson  Agency::  [facility], [facility]  Relationship::  Ship broker Information:     Housing/Transportation Living arrangements for the past 2 months:  Single Family Home Source of Information:  Other (Comment Required)(Grandson) Patient Interpreter Needed:  None Criminal Activity/Legal Involvement Pertinent to Current Situation/Hospitalization:  No - Comment as needed Significant Relationships:  Other Family Members Lives with:  Self Do you feel safe going back to the place where you live?  No Need for family participation in patient care:  No (Coment)  Care giving concerns:  Pt spoke with pt's grandson at bedside. Pt is only alert to self and place.   Social Worker assessment / plan:  CSW spoke with pt's grandson. At this time family is looking into SNF placement. Pt's family would prefer [facility] or [facility]. CSW to reach out to facilities and follow up with family.  Employment status:  Retired Forensic scientist:  Medicare PT Recommendations:  Inpatient Independence / Referral to community resources:  [place]  Patient/Family's Response to care:  Pt's grandson verbalized understanding of CSW role and expressed appreciation for support. Pt's grandson denies any concern regarding pt care at this time.   Patient/Family's Understanding of and Emotional Response to Diagnosis, Current Treatment, and Prognosis:  Pt's grandson understands the need for pt to go to SNF--Grandson agreeable at this time. Pt's grandson understands pt's prognosis and denies any questions or concerns at this time.   Emotional  Assessment Appearance:  Appears stated age Attitude/Demeanor/Rapport:  Unable to Assess Affect (typically observed):  Unable to Assess Orientation:  Oriented to Self, Oriented to Place Alcohol / Substance use:  Not Applicable Psych involvement (Current and /or in the community):  No (Comment)  Discharge Needs  Concerns to be addressed:  Care Coordination, Basic Needs Readmission within the last 30 days:  No Current discharge risk:  Dependent with Mobility Barriers to Discharge:  Continued Medical Work up   [employer], LCSW 02/14/2018, 3:34 PM

## 2018-02-14 NOTE — Progress Notes (Signed)
PROGRESS NOTE    Erin Cox Erin  XFG:[id number] DOB: 12-05-1920 DOA: 02/10/2018 PCP: [name], MD   Brief Narrative:  HPI On 02/10/2018 by Dr. Arie Sabina Erin Cox is a 84 y.o. female with medical history significant of hypertension, COPD, TIA, anxiety, coronary artery stenosis, CK-3, who presents with altered mental status, slurred speech, gait instability.  Per her granddaughter, patient was noted to be mildly confused yesterday morning at about 7 AM. She also has mild slurred speech and unsteady gait. Patient is forgetful than her baseline. No vision change or hearing loss. Patient moves all extremities normally, does not seem to have unilateral weakness in extremities per her granddaughter. Patient refused to come to the hospital yesterday. Patient does not have chest pain, shortness breath, cough, fever or chills. No nausea, vomiting, diarrhea or abdominal pain. Patient has increased urinary frequency, but no dysuria or burning on urination.  Interim history Found to have acute CVA.  PT OT recommended inpatient rehab.  Pending inpatient rehab assessment.  Assessment & Plan   Acute CVA -Patient presented with unsteady gait as well as slurring speech -CT head showed no acute finding -MRI brain showed moderate sized acute nonhemorrhagic pontine infarct.  Chronic appearing occlusion left ICA -MRA neck and head: Occluded left vertebral artery with reconstitution of the distal vertebral artery proximal to the vertebrobasilar junction.  Left ICA is occluded -Echocardiogram: EF of 96-78%, grade 1 diastolic dysfunction -LDL 96, hemoglobin A1c 5.2 -PT and OT recommended inpatient rehab -Currently on aspirin, Plavix -Patient has a statin intolerance -Neurology consulted and appreciated.  Discussed with Dr. Leonie Man, recommended starting Crestor 5 mg daily along with co-Q10 200 mg daily -inpatient rehab consulted and appreciated, social worker consulted for possible SNF placement  Acute  metabolic encephalopathy -possibly has underlying dementia vs secondary to CVA -Currently stable -Continue to monitor  Essential hypertension -Clonidine and metoprolol continued -Plendil, Cozaar held due to CVA, allowing for permissive hypertension  Hyperlipidemia -Panel shows a total cholesterol 218, HDL 102, LDL 96, triglycerides 101 -Patient has had a statin intolerance in the past.  Neurology recommended starting Crestor 5 mg daily along with co-Q10  Chronic kidney disease, Stage III -Stable, continue to monitor BMP  COPD -Stable, continue Dulera, theophylline nebs  Anxiety  -Continue clonidine   Severe protein calorie malnutrition -nutrition consult , continue feeding supplements  DVT Prophylaxis  Lovenox  Code Status: DNR  Family Communication: Family at bedside  Disposition Plan: Admitted, dispo pending- CIR vs SNF  Consultants Neurology Inpatient rehab  Procedures  Echocardiogram  Antibiotics   Anti-infectives (From admission, onward)   None      Subjective:   Erin Cox seen and examined today.  Feeling better today. Has no complaints. Denies current chest pain, shortness of breath, abdominal pain, nausea, vomiting, diarrhea, constipation, dizziness, headache.   Objective:   Vitals:   02/14/18 0407 02/14/18 0738 02/14/18 0932 02/14/18 0939  BP: (!) 141/75 (!) 176/91    Pulse: 75 79  74  Resp: 16 16    Temp:  97.7 F (36.5 C)    TempSrc:  Oral    SpO2: 99% 98% 97%   Weight:      Height:        Intake/Output Summary (Last 24 hours) at 02/14/2018 1137 Last data filed at 02/14/2018 0600 Gross per 24 hour  Intake 1875 ml  Output 550 ml  Net 1325 ml   Filed Weights   02/10/18 2357  Weight: 38.7 kg (85 lb 5.1  oz)   Exam  General: Well developed, thin, elderly, NAD  HEENT: NCAT, mucous membranes moist.   Neck: Supple  Cardiovascular: S1 S2 auscultated, no murmurs, RRR  Respiratory: Clear to auscultation bilaterally with equal chest  rise  Abdomen: Soft, nontender, nondistended, + bowel sounds  Extremities: warm dry without cyanosis clubbing or edema  Neuro: AAOx3, nonfocal  Psych: Pleasant, appropriate mood and affect   Data Reviewed: I have personally reviewed following labs and imaging studies  CBC: Recent Labs  Lab 02/10/18 1052 02/10/18 1105  WBC 6.2  --   NEUTROABS 4.5  --   HGB 12.9 13.6  HCT 40.8 40.0  MCV 95.8  --   PLT 233  --    Basic Metabolic Panel: Recent Labs  Lab 02/10/18 1052 02/10/18 1105  NA 140 140  K 4.1 4.1  CL 102 102  CO2 27  --   GLUCOSE 109* 105*  BUN 28* 29*  CREATININE 1.24* 1.20*  CALCIUM 10.2  --    GFR: Estimated Creatinine Clearance: 13.1 mL/min (A) (by C-G formula based on SCr of 1.2 mg/dL (H)). Liver Function Tests: Recent Labs  Lab 02/10/18 1052  AST 24  ALT 15  ALKPHOS 102  BILITOT 0.6  PROT 7.1  ALBUMIN 4.1   No results for input(s): LIPASE, AMYLASE in the last 168 hours. No results for input(s): AMMONIA in the last 168 hours. Coagulation Profile: Recent Labs  Lab 02/10/18 1052  INR 0.97   Cardiac Enzymes: No results for input(s): CKTOTAL, CKMB, CKMBINDEX, TROPONINI in the last 168 hours. BNP (last 3 results) No results for input(s): PROBNP in the last 8760 hours. HbA1C: No results for input(s): HGBA1C in the last 72 hours. CBG: Recent Labs  Lab 02/10/18 1042  GLUCAP 113*   Lipid Profile: No results for input(s): CHOL, HDL, LDLCALC, TRIG, CHOLHDL, LDLDIRECT in the last 72 hours. Thyroid Function Tests: No results for input(s): TSH, T4TOTAL, FREET4, T3FREE, THYROIDAB in the last 72 hours. Anemia Panel: No results for input(s): VITAMINB12, FOLATE, FERRITIN, TIBC, IRON, RETICCTPCT in the last 72 hours. Urine analysis:    Component Value Date/Time   COLORURINE YELLOW 02/10/2018 [place] 02/10/2018 1219   LABSPEC 1.013 02/10/2018 1219   PHURINE 7.0 02/10/2018 1219   GLUCOSEU NEGATIVE 02/10/2018 1219   HGBUR  NEGATIVE 02/10/2018 1219   BILIRUBINUR NEGATIVE 02/10/2018 1219   KETONESUR NEGATIVE 02/10/2018 1219   PROTEINUR 30 (A) 02/10/2018 1219   UROBILINOGEN 0.2 02/14/2012 0736   NITRITE NEGATIVE 02/10/2018 1219   LEUKOCYTESUR NEGATIVE 02/10/2018 1219   Sepsis Labs: @LABRCNTIP (procalcitonin:4,lacticidven:4)  )No results found for this or any previous visit (from the past 240 hour(s)).    Radiology Studies: No results found.   Scheduled Meds: . aspirin EC  81 mg Oral QPM  . clonazePAM  0.5 mg Oral QHS  . cloNIDine  0.2 mg Oral QHS  . clopidogrel  75 mg Oral Daily  . enoxaparin (LOVENOX) injection  30 mg Subcutaneous Q24H  . feeding supplement (ENSURE ENLIVE)  237 mL Oral BID BM  . metoprolol succinate  25 mg Oral BID  . mometasone-formoterol  2 puff Inhalation BID  . OLANZapine  2.5 mg Oral QHS  . rosuvastatin  5 mg Oral q1800  . theophylline  150 mg Oral Daily  . vitamin B-12  1,000 mcg Oral Daily  . vitamin C  500 mg Oral Daily  . vitamin E  400 Units Oral Daily   Continuous Infusions:  LOS: 4 days   Time Spent in minutes   30 minutes  Saunders Arlington D.O. on 02/14/2018 at 11:37 AM  Between 7am to 7pm - Pager - [phone]  After 7pm go to [email or web address] - password [email or web address]  And look for the night coverage person covering for me after hours  [facility] Office  [phone]

## 2018-02-14 NOTE — Plan of Care (Signed)
  Problem: Health Behavior/Discharge Planning: Goal: Ability to manage health-related needs will improve Outcome: Progressing   Problem: Education: Goal: Knowledge of patient specific risk factors addressed and post discharge goals established will improve Outcome: Progressing   Problem: Education: Goal: Knowledge of disease or condition will improve Outcome: Progressing   Problem: Self-Care: Goal: Ability to participate in self-care as condition permits will improve Outcome: Progressing   Problem: Self-Care: Goal: Verbalization of feelings and concerns over difficulty with self-care will improve Outcome: Progressing   Problem: Clinical Measurements: Goal: Ability to maintain clinical measurements within normal limits will improve Outcome: Progressing

## 2018-02-14 NOTE — Progress Notes (Signed)
I met with patient and a granddaughter in law at bedside to discuss goals and expectations of an inpt rehab admit. Family state they can not provide 24/7 supervision at d/c but she will talk with family and let me know if this can be arranged. I spoke that SNF would be recommended if caregiver support could not be arranged. I will follow up tomorrow. [phone]

## 2018-02-16 DIAGNOSIS — I10 Essential (primary) hypertension: Secondary | ICD-10-CM | POA: Diagnosis not present

## 2018-02-16 DIAGNOSIS — N183 Chronic kidney disease, stage 3 (moderate): Secondary | ICD-10-CM | POA: Diagnosis not present

## 2018-02-16 DIAGNOSIS — J449 Chronic obstructive pulmonary disease, unspecified: Secondary | ICD-10-CM | POA: Diagnosis not present

## 2018-02-16 DIAGNOSIS — E43 Unspecified severe protein-calorie malnutrition: Secondary | ICD-10-CM | POA: Diagnosis not present

## 2018-02-16 DIAGNOSIS — I639 Cerebral infarction, unspecified: Secondary | ICD-10-CM | POA: Diagnosis not present

## 2018-02-16 DIAGNOSIS — E785 Hyperlipidemia, unspecified: Secondary | ICD-10-CM | POA: Diagnosis not present

## 2018-03-11 DIAGNOSIS — E43 Unspecified severe protein-calorie malnutrition: Secondary | ICD-10-CM | POA: Diagnosis not present

## 2018-03-11 DIAGNOSIS — I639 Cerebral infarction, unspecified: Secondary | ICD-10-CM | POA: Diagnosis not present

## 2018-03-11 DIAGNOSIS — E785 Hyperlipidemia, unspecified: Secondary | ICD-10-CM | POA: Diagnosis not present

## 2018-03-11 DIAGNOSIS — N183 Chronic kidney disease, stage 3 (moderate): Secondary | ICD-10-CM | POA: Diagnosis not present

## 2018-03-11 DIAGNOSIS — I10 Essential (primary) hypertension: Secondary | ICD-10-CM | POA: Diagnosis not present

## 2018-03-12 DIAGNOSIS — R41841 Cognitive communication deficit: Secondary | ICD-10-CM | POA: Diagnosis not present

## 2018-03-12 DIAGNOSIS — I69328 Other speech and language deficits following cerebral infarction: Secondary | ICD-10-CM | POA: Diagnosis not present

## 2018-03-12 DIAGNOSIS — J449 Chronic obstructive pulmonary disease, unspecified: Secondary | ICD-10-CM | POA: Diagnosis not present

## 2018-03-12 DIAGNOSIS — E43 Unspecified severe protein-calorie malnutrition: Secondary | ICD-10-CM | POA: Diagnosis not present

## 2018-03-12 DIAGNOSIS — R4781 Slurred speech: Secondary | ICD-10-CM | POA: Diagnosis not present

## 2018-03-12 DIAGNOSIS — N183 Chronic kidney disease, stage 3 (moderate): Secondary | ICD-10-CM | POA: Diagnosis not present

## 2018-03-12 DIAGNOSIS — I129 Hypertensive chronic kidney disease with stage 1 through stage 4 chronic kidney disease, or unspecified chronic kidney disease: Secondary | ICD-10-CM | POA: Diagnosis not present

## 2018-03-12 DIAGNOSIS — G9341 Metabolic encephalopathy: Secondary | ICD-10-CM | POA: Diagnosis not present

## 2018-03-12 DIAGNOSIS — R1312 Dysphagia, oropharyngeal phase: Secondary | ICD-10-CM | POA: Diagnosis not present

## 2018-03-12 DIAGNOSIS — M6281 Muscle weakness (generalized): Secondary | ICD-10-CM | POA: Diagnosis not present

## 2018-03-12 DIAGNOSIS — I6522 Occlusion and stenosis of left carotid artery: Secondary | ICD-10-CM | POA: Diagnosis not present

## 2018-03-12 DIAGNOSIS — I69398 Other sequelae of cerebral infarction: Secondary | ICD-10-CM | POA: Diagnosis not present

## 2018-03-13 DIAGNOSIS — I69398 Other sequelae of cerebral infarction: Secondary | ICD-10-CM | POA: Diagnosis not present

## 2018-03-13 DIAGNOSIS — M6281 Muscle weakness (generalized): Secondary | ICD-10-CM | POA: Diagnosis not present

## 2018-03-14 DIAGNOSIS — M6281 Muscle weakness (generalized): Secondary | ICD-10-CM | POA: Diagnosis not present

## 2018-03-14 DIAGNOSIS — I69398 Other sequelae of cerebral infarction: Secondary | ICD-10-CM | POA: Diagnosis not present

## 2018-03-15 DIAGNOSIS — M6281 Muscle weakness (generalized): Secondary | ICD-10-CM | POA: Diagnosis not present

## 2018-03-15 DIAGNOSIS — I69398 Other sequelae of cerebral infarction: Secondary | ICD-10-CM | POA: Diagnosis not present

## 2018-03-16 DIAGNOSIS — I10 Essential (primary) hypertension: Secondary | ICD-10-CM | POA: Diagnosis not present

## 2018-03-16 DIAGNOSIS — J449 Chronic obstructive pulmonary disease, unspecified: Secondary | ICD-10-CM | POA: Diagnosis not present

## 2018-03-16 DIAGNOSIS — I639 Cerebral infarction, unspecified: Secondary | ICD-10-CM | POA: Diagnosis not present

## 2018-03-17 DIAGNOSIS — I69398 Other sequelae of cerebral infarction: Secondary | ICD-10-CM | POA: Diagnosis not present

## 2018-03-17 DIAGNOSIS — M6281 Muscle weakness (generalized): Secondary | ICD-10-CM | POA: Diagnosis not present

## 2018-03-18 ENCOUNTER — Encounter: Payer: Self-pay | Admitting: Adult Health

## 2018-03-18 ENCOUNTER — Ambulatory Visit (INDEPENDENT_AMBULATORY_CARE_PROVIDER_SITE_OTHER): Payer: Medicare Other | Admitting: Adult Health

## 2018-03-18 VITALS — BP 122/70 | HR 70 | Ht 60.0 in | Wt 83.0 lb

## 2018-03-18 DIAGNOSIS — I10 Essential (primary) hypertension: Secondary | ICD-10-CM | POA: Diagnosis not present

## 2018-03-18 DIAGNOSIS — M6281 Muscle weakness (generalized): Secondary | ICD-10-CM | POA: Diagnosis not present

## 2018-03-18 DIAGNOSIS — I69398 Other sequelae of cerebral infarction: Secondary | ICD-10-CM | POA: Diagnosis not present

## 2018-03-18 DIAGNOSIS — J449 Chronic obstructive pulmonary disease, unspecified: Secondary | ICD-10-CM

## 2018-03-18 DIAGNOSIS — E785 Hyperlipidemia, unspecified: Secondary | ICD-10-CM

## 2018-03-18 DIAGNOSIS — I63511 Cerebral infarction due to unspecified occlusion or stenosis of right middle cerebral artery: Secondary | ICD-10-CM

## 2018-03-18 NOTE — Progress Notes (Signed)
[facility] [address] [place] Erin Cox [id number] [phone]       OFFICE FOLLOW UP NOTE  [name] Date of Birth:  1921-05-27 Medical Record Number:  [id number]   Reason for Referral:  hospital stroke follow up  CHIEF COMPLAINT:  Chief Complaint  Patient presents with  . Follow-up    Stroke hospital follow up, Pt was seen by Dr. Erlinda Hong in the hospital, Patient is with Erin Cox granddaughter, Erin Cox granddaughter    HPI: Erin Cox is being seen today for initial visit in the office for right pontine infarct on 02/10/18. History obtained from granddaughters, patient and chart review. Reviewed all radiology images and labs personally.  [name] is a 84 y.o. female with history of carotid artery obstruction, asthma, COPD, hypertension, memory problems and previous TIA presenting with unsteady gait and slurred speech. She did not receive IV t-PA due to late presentation.  CT head reviewed and showed no acute finding.  MRI head reviewed and showed moderate sized acute nonhemorrhagic pontine infarct.  MRA head and neck showed left ICA chronic occlusion along with occluded left nondominant VA with distal reconstitution.  2D echo showed an EF of 65 to 70%.  This right pontine infarct is likely due to small vessel disease.  LDL 96 and this patient was on no statin PTA recommended to start Crestor 5 mg.  Patient was previously on aspirin 81 mg and Plavix PTA and recommended to continue this at discharge.  Recommended discharge to CIR to continue therapy.  Patient ended up being discharged to [facility] rehab for 3 weeks.  Patient returns today for follow-up visit and is accompanied by her granddaughters.  Patient is currently living at home where she receives home PT/OT/SLP.  She continues to be slightly weak which she uses wheelchair for long distance but rolling walker for short distance around the house.  Granddaughters also have concern of dysphagia and consistent  coughing/choking on food.  She is currently eating pure and nectar thick liquid diet.  Critical also concerned about decrease in appetite where she has lost 11 pounds since returning home from nursing home.  Family and nursing aides are currently providing 24/7 care as patient is unable to be left alone at this time.  Continues to take aspirin and Plavix without effects of bleeding or bruising.  Continues to take Crestor without side effects of myalgias.  Blood pressure today satisfactory 122/70.  Granddaughters do have concerns about decrease in short-term memory along with decreasing concentration such as when she is trying to read a book she is unable to follow along.  States long-term memory is good at this time.  Denies new or worsening stroke/TIA symptoms.   ROS:   14 system review of systems performed and negative with exception of runny nose, trouble swallowing, cough, wheezing, shortness of breath, choking, leg swelling, cold intolerance, food allergies, walking difficulty, memory loss, headache, weakness, tremors, agitation, confusion, and decreased concentration  PMH:  Past Medical History:  Diagnosis Date  . Asthma   . Carotid artery obstruction   . COPD (chronic obstructive pulmonary disease) (Erin Cox)   . Hypertension   . Stroke ([place])   . TIA (transient ischemic attack)     PSH:  Past Surgical History:  Procedure Laterality Date  . APPENDECTOMY      Social History:  Social History   Socioeconomic History  . Marital status: Widowed    Spouse name: Not on file  . Number of  children: Not on file  . Years of education: Not on file  . Highest education level: Not on file  Occupational History  . Not on file  Social Needs  . Financial resource strain: Not on file  . Food insecurity:    Worry: Not on file    Inability: Not on file  . Transportation needs:    Medical: Not on file    Non-medical: Not on file  Tobacco Use  . Smoking status: Never Smoker  . Smokeless  tobacco: Never Used  Substance and Sexual Activity  . Alcohol use: No  . Drug use: No  . Sexual activity: Not on file  Lifestyle  . Physical activity:    Days per week: Not on file    Minutes per session: Not on file  . Stress: Not on file  Relationships  . Social connections:    Talks on phone: Not on file    Gets together: Not on file    Attends religious service: Not on file    Active member of club or organization: Not on file    Attends meetings of clubs or organizations: Not on file    Relationship status: Not on file  . Intimate partner violence:    Fear of current or ex partner: Not on file    Emotionally abused: Not on file    Physically abused: Not on file    Forced sexual activity: Not on file  Other Topics Concern  . Not on file  Social History Narrative  . Not on file    Family History:  Family History  Problem Relation Age of Onset  . Hypertension Mother   . Heart attack Father     Medications:   Current Outpatient Medications on File Prior to Visit  Medication Sig Dispense Refill  . Ascorbic Acid (VITAMIN C) 1000 MG tablet Take 500 mg by mouth daily.     Marland Kitchen aspirin EC 81 MG tablet Take 81 mg by mouth every evening.    . budesonide-formoterol (SYMBICORT) 160-4.5 MCG/ACT inhaler Inhale 2 puffs into the lungs 2 (two) times daily.    . Calcium Citrate (CITRACAL PO) Take 2 tablets by mouth daily.    . carbamazepine (TEGRETOL) 100 MG chewable tablet Chew 100 mg by mouth 3 (three) times daily as needed (for headaches).     . clonazePAM (KLONOPIN) 0.5 MG tablet Take 1 tablet (0.5 mg total) by mouth at bedtime. For anxiety 30 tablet 0  . cloNIDine (CATAPRES) 0.2 MG tablet Take 0.2 mg by mouth at bedtime.    . clopidogrel (PLAVIX) 75 MG tablet Take 75 mg by mouth daily.    Marland Kitchen co-enzyme Q-10 50 MG capsule Take 2 capsules (100 mg total) by mouth daily.    . felodipine (PLENDIL) 10 MG 24 hr tablet Take 5 mg by mouth daily.    Marland Kitchen losartan (COZAAR) 100 MG tablet Take 100  mg by mouth daily.    . metoprolol succinate (TOPROL-XL) 25 MG 24 hr tablet Take 25 mg by mouth 2 (two) times daily.    Marland Kitchen OLANZapine (ZYPREXA) 5 MG tablet Take 2.5 mg by mouth at bedtime.    . potassium gluconate 595 MG TABS Take 595 mg by mouth daily.    . rosuvastatin (CRESTOR) 5 MG tablet Take 1 tablet (5 mg total) by mouth daily at 6 PM. 30 tablet 0  . theophylline (THEODUR) 300 MG 12 hr tablet Take 150 mg by mouth daily.    Marland Kitchen UNABLE  TO FIND Magic cup daily    . vitamin B-12 (CYANOCOBALAMIN) 1000 MCG tablet Take 1,000 mcg by mouth daily.    . vitamin E 400 UNIT capsule Take 400 Units by mouth daily.    Marland Kitchen OLANZapine (ZYPREXA) 2.5 MG tablet      No current facility-administered medications on file prior to visit.     Allergies:   Allergies  Allergen Reactions  . Lipitor [Atorvastatin] Other (See Comments)    Muscle contractions and fuzzy feeling  . Other Hives and Swelling    Reaction to walnuts  . Penicillins Hives    Has patient had a PCN reaction causing immediate rash, facial/tongue/throat swelling, SOB or lightheadedness with hypotension: Yes Has patient had a PCN reaction causing severe rash involving mucus membranes or skin necrosis: No Has patient had a PCN reaction that required hospitalization: No Has patient had a PCN reaction occurring within the last 10 years: Yes If all of the above answers are "NO", then may proceed with Cephalosporin use.  . Statins Other (See Comments)    Muscle contractions and fuzzy feeling  . Sulfa Antibiotics Other (See Comments)    Made asthma worse  . Iodine Rash     Physical Exam  Vitals:   03/18/18 1106  BP: 122/70  Pulse: 70  Weight: 83 lb (37.6 kg)  Height: 5' (1.524 m)   Body mass index is 16.21 kg/m. No exam data present  General: Frail pleasant elderly Caucasian female, seated, in no evident distress Head: head normocephalic and atraumatic.   Neck: supple with no carotid or supraclavicular bruits Cardiovascular:  regular rate and rhythm, no murmurs Musculoskeletal: no deformity Skin:  no rash/petichiae Vascular:  Normal pulses all extremities  Neurologic Exam Mental Status: Awake and fully alert. Oriented to place and time. Recent and remote memory intact. Attention span, concentration and fund of knowledge appropriate. Mood and affect appropriate.  Cranial Nerves: Fundoscopic exam reveals sharp disc margins. Pupils equal, briskly reactive to light. Extraocular movements full without nystagmus. Visual fields full to confrontation. Hearing intact. Facial sensation intact. Face, tongue, palate moves normally and symmetrically.  Motor: Normal bulk and tone. Normal strength in all tested extremity muscles.  Sensory.: intact to touch , pinprick , position and vibratory sensation.  Coordination: Rapid alternating movements normal in all extremities. Finger-to-nose and heel-to-shin performed accurately bilaterally. Gait and Station: Arises from chair without difficulty. Stance is hunched. Gait demonstrates short cautious steps with assistance of rolling walker.  Tandem gait not tested. Reflexes: 1+ and symmetric. Toes downgoing.    NIHSS  0 Modified Rankin  3    Diagnostic Data (Labs, Imaging, Testing)  CT HEAD WO CONTRAST 02/10/18 IMPRESSION: No acute finding by CT. Advanced atrophy and chronic small-vessel ischemic changes throughout the brain, progressive since 2011.  MR BRAIN WO CONTRAST 02/10/2018 IMPRESSION: Moderate-sized acute nonhemorrhagic RIGHT pontine infarct. Wide patency of the adjacent basilar artery suggests small-vessel insult. Advanced atrophy. Chronic appearing occlusion LEFT ICA.  MR MRA HEAD WO CONTRAST MR MRA NECK WO CONTRAST 02/10/2018 IMPRESSION: 1. Occluded left vertebral artery with reconstitution of the distal vertebral artery proximal to the vertebrobasilar junction. 2. Dominant right vertebral artery without focal stenosis. 3. Left internal carotid artery is  occluded at the bifurcation with patent anterior communicating artery and normal appearance of the A1 and M1 segments bilaterally. 4. Moderate diffuse medium and small vessel disease.  ECHOCARDIOGRAM 02/11/2018 Study Conclusions - Left ventricle: The cavity size was normal. There was mild   concentric hypertrophy. Systolic  function was vigorous. The   estimated ejection fraction was in the range of 65% to 70%. Wall   motion was normal; there were no regional wall motion   abnormalities. Doppler parameters are consistent with abnormal   left ventricular relaxation (grade 1 diastolic dysfunction).   Doppler parameters are consistent with elevated mean left atrial   filling pressure. - Aortic valve: Valve area (VTI): 1.94 cm^2. Valve area (Vmax): 2   cm^2. Valve area (Vmean): 2.06 cm^2. - Mitral valve: Moderately calcified annulus. Valve area by   continuity equation (using LVOT flow): 1.72 cm^2. - Pulmonary arteries: PA peak pressure: 33 mm Hg (S).    ASSESSMENT: Alizey Noren is a 84 y.o. year old female here with right pontine infarct on 02/10/18 secondary to small vessel disease. Vascular risk factors include HTN, HLD.     PLAN: -Continue aspirin 81 mg daily and clopidogrel 75 mg daily  and Crestor  for secondary stroke prevention -F/u with PCP regarding your HLD and HTN management -continue to monitor BP at home -Place herd order for barium swallow due to concerns of dysphagia  -continue PT/OT/SLP -Maintain strict control of hypertension with blood pressure goal below 130/90, diabetes with hemoglobin A1c goal below 6.5% and cholesterol with LDL cholesterol (bad cholesterol) goal below 70 mg/dL. I also advised the patient to eat a healthy diet with plenty of whole grains, cereals, fruits and vegetables, exercise regularly and maintain ideal body weight.  Follow up in 3 months or call earlier if needed   Greater than 50% of time during this 25 minute visit was spent on  counseling,explanation of diagnosis of right pontine, reviewing risk factor management of HLD and HTN, planning of further management, discussion with patient and family and coordination of care    Venancio Poisson, [facility]  [facility] [address] [place], [place] [address]  Phone [phone] Fax [phone]

## 2018-03-18 NOTE — Patient Instructions (Addendum)
Continue aspirin 81 mg daily and clopidogrel 75 mg daily  and crestor  for secondary stroke prevention  Continue to follow up with PCP regarding cholesterol and blood pressure management   Continue to monitor blood pressure at home  Continue to work with physical, occupational and speech therapies  We will call and schedule barium swallow test   Maintain strict control of hypertension with blood pressure goal below 130/90, diabetes with hemoglobin A1c goal below 6.5% and cholesterol with LDL cholesterol (bad cholesterol) goal below 70 mg/dL. I also advised the patient to eat a healthy diet with plenty of whole grains, cereals, fruits and vegetables, exercise regularly and maintain ideal body weight.  Followup in the future with me in 3 months or call earlier if needed       Thank you for coming to see [place] at [facility]. I hope we have been able to provide you high quality care today.  You may receive a patient satisfaction survey over the next few weeks. We would appreciate your feedback and comments so that we may continue to improve ourselves and the health of our patients.

## 2018-03-18 NOTE — Progress Notes (Signed)
I reviewed above note and agree with the assessment and plan.   [name], MD PhD Stroke Neurology 03/18/2018 12:36 PM

## 2018-03-21 DIAGNOSIS — I69398 Other sequelae of cerebral infarction: Secondary | ICD-10-CM | POA: Diagnosis not present

## 2018-03-21 DIAGNOSIS — M6281 Muscle weakness (generalized): Secondary | ICD-10-CM | POA: Diagnosis not present

## 2018-03-22 ENCOUNTER — Inpatient Hospital Stay (HOSPITAL_COMMUNITY): Payer: Medicare Other

## 2018-03-22 ENCOUNTER — Encounter (HOSPITAL_COMMUNITY): Payer: Self-pay | Admitting: *Deleted

## 2018-03-22 ENCOUNTER — Emergency Department (HOSPITAL_COMMUNITY): Payer: Medicare Other

## 2018-03-22 ENCOUNTER — Other Ambulatory Visit: Payer: Self-pay

## 2018-03-22 ENCOUNTER — Inpatient Hospital Stay (HOSPITAL_COMMUNITY)
Admission: EM | Admit: 2018-03-22 | Discharge: 2018-03-28 | DRG: 056 | Disposition: A | Discharge status: Hospice | Payer: Medicare Other | Attending: Internal Medicine | Admitting: Internal Medicine

## 2018-03-22 DIAGNOSIS — Z515 Encounter for palliative care: Secondary | ICD-10-CM | POA: Diagnosis present

## 2018-03-22 DIAGNOSIS — R1310 Dysphagia, unspecified: Secondary | ICD-10-CM

## 2018-03-22 DIAGNOSIS — R531 Weakness: Secondary | ICD-10-CM | POA: Diagnosis not present

## 2018-03-22 DIAGNOSIS — Z7189 Other specified counseling: Secondary | ICD-10-CM

## 2018-03-22 DIAGNOSIS — M4854XA Collapsed vertebra, not elsewhere classified, thoracic region, initial encounter for fracture: Secondary | ICD-10-CM | POA: Diagnosis present

## 2018-03-22 DIAGNOSIS — I69320 Aphasia following cerebral infarction: Secondary | ICD-10-CM

## 2018-03-22 DIAGNOSIS — M48061 Spinal stenosis, lumbar region without neurogenic claudication: Secondary | ICD-10-CM | POA: Diagnosis not present

## 2018-03-22 DIAGNOSIS — G8929 Other chronic pain: Secondary | ICD-10-CM | POA: Diagnosis not present

## 2018-03-22 DIAGNOSIS — R1319 Other dysphagia: Secondary | ICD-10-CM

## 2018-03-22 DIAGNOSIS — N183 Chronic kidney disease, stage 3 unspecified: Secondary | ICD-10-CM | POA: Diagnosis present

## 2018-03-22 DIAGNOSIS — I69391 Dysphagia following cerebral infarction: Secondary | ICD-10-CM | POA: Diagnosis not present

## 2018-03-22 DIAGNOSIS — I69398 Other sequelae of cerebral infarction: Secondary | ICD-10-CM | POA: Diagnosis not present

## 2018-03-22 DIAGNOSIS — Z66 Do not resuscitate: Secondary | ICD-10-CM | POA: Diagnosis present

## 2018-03-22 DIAGNOSIS — M6281 Muscle weakness (generalized): Secondary | ICD-10-CM | POA: Diagnosis not present

## 2018-03-22 DIAGNOSIS — Z7982 Long term (current) use of aspirin: Secondary | ICD-10-CM

## 2018-03-22 DIAGNOSIS — E43 Unspecified severe protein-calorie malnutrition: Secondary | ICD-10-CM | POA: Diagnosis not present

## 2018-03-22 DIAGNOSIS — Z681 Body mass index (BMI) 19 or less, adult: Secondary | ICD-10-CM | POA: Diagnosis not present

## 2018-03-22 DIAGNOSIS — M4856XA Collapsed vertebra, not elsewhere classified, lumbar region, initial encounter for fracture: Secondary | ICD-10-CM | POA: Diagnosis present

## 2018-03-22 DIAGNOSIS — I129 Hypertensive chronic kidney disease with stage 1 through stage 4 chronic kidney disease, or unspecified chronic kidney disease: Secondary | ICD-10-CM | POA: Diagnosis present

## 2018-03-22 DIAGNOSIS — M549 Dorsalgia, unspecified: Secondary | ICD-10-CM | POA: Diagnosis present

## 2018-03-22 DIAGNOSIS — M545 Low back pain, unspecified: Secondary | ICD-10-CM

## 2018-03-22 DIAGNOSIS — R627 Adult failure to thrive: Secondary | ICD-10-CM | POA: Diagnosis present

## 2018-03-22 DIAGNOSIS — Z7902 Long term (current) use of antithrombotics/antiplatelets: Secondary | ICD-10-CM

## 2018-03-22 DIAGNOSIS — F419 Anxiety disorder, unspecified: Secondary | ICD-10-CM | POA: Diagnosis present

## 2018-03-22 DIAGNOSIS — R000 Tachycardia, unspecified: Secondary | ICD-10-CM | POA: Diagnosis not present

## 2018-03-22 DIAGNOSIS — Z7951 Long term (current) use of inhaled steroids: Secondary | ICD-10-CM

## 2018-03-22 DIAGNOSIS — J449 Chronic obstructive pulmonary disease, unspecified: Secondary | ICD-10-CM | POA: Diagnosis present

## 2018-03-22 DIAGNOSIS — Z79899 Other long term (current) drug therapy: Secondary | ICD-10-CM

## 2018-03-22 DIAGNOSIS — I639 Cerebral infarction, unspecified: Secondary | ICD-10-CM

## 2018-03-22 DIAGNOSIS — R0602 Shortness of breath: Secondary | ICD-10-CM

## 2018-03-22 DIAGNOSIS — I4891 Unspecified atrial fibrillation: Secondary | ICD-10-CM | POA: Diagnosis present

## 2018-03-22 DIAGNOSIS — S5002XA Contusion of left elbow, initial encounter: Secondary | ICD-10-CM | POA: Diagnosis not present

## 2018-03-22 DIAGNOSIS — R030 Elevated blood-pressure reading, without diagnosis of hypertension: Secondary | ICD-10-CM | POA: Diagnosis not present

## 2018-03-22 DIAGNOSIS — M47816 Spondylosis without myelopathy or radiculopathy, lumbar region: Secondary | ICD-10-CM | POA: Diagnosis not present

## 2018-03-22 DIAGNOSIS — J439 Emphysema, unspecified: Secondary | ICD-10-CM | POA: Diagnosis not present

## 2018-03-22 DIAGNOSIS — I10 Essential (primary) hypertension: Secondary | ICD-10-CM | POA: Diagnosis present

## 2018-03-22 HISTORY — DX: Dorsalgia, unspecified: M54.9

## 2018-03-22 HISTORY — DX: Collapsed vertebra, not elsewhere classified, lumbar region, initial encounter for fracture: M48.56XA

## 2018-03-22 LAB — CBC WITH DIFFERENTIAL/PLATELET
Abs Immature Granulocytes: 0.1 10*3/uL (ref 0.0–0.1)
Basophils Absolute: 0.1 10*3/uL (ref 0.0–0.1)
Basophils Relative: 1 %
EOS ABS: 0.3 10*3/uL (ref 0.0–0.7)
EOS PCT: 4 %
HCT: 43.7 % (ref 36.0–46.0)
Hemoglobin: 13.6 g/dL (ref 12.0–15.0)
IMMATURE GRANULOCYTES: 1 %
LYMPHS ABS: 1.1 10*3/uL (ref 0.7–4.0)
Lymphocytes Relative: 13 %
MCH: 29.6 pg (ref 26.0–34.0)
MCHC: 31.1 g/dL (ref 30.0–36.0)
MCV: 95 fL (ref 78.0–100.0)
Monocytes Absolute: 0.9 10*3/uL (ref 0.1–1.0)
Monocytes Relative: 11 %
NEUTROS PCT: 70 %
Neutro Abs: 5.6 10*3/uL (ref 1.7–7.7)
Platelets: 217 10*3/uL (ref 150–400)
RBC: 4.6 MIL/uL (ref 3.87–5.11)
RDW: 13.4 % (ref 11.5–15.5)
WBC: 8 10*3/uL (ref 4.0–10.5)

## 2018-03-22 LAB — COMPREHENSIVE METABOLIC PANEL
ALT: 15 U/L (ref 14–54)
AST: 22 U/L (ref 15–41)
Albumin: 3.8 g/dL (ref 3.5–5.0)
Alkaline Phosphatase: 94 U/L (ref 38–126)
Anion gap: 11 (ref 5–15)
BILIRUBIN TOTAL: 0.7 mg/dL (ref 0.3–1.2)
BUN: 34 mg/dL — AB (ref 6–20)
CO2: 30 mmol/L (ref 22–32)
CREATININE: 1.34 mg/dL — AB (ref 0.44–1.00)
Calcium: 10 mg/dL (ref 8.9–10.3)
Chloride: 102 mmol/L (ref 101–111)
GFR calc Af Amer: 38 mL/min — ABNORMAL LOW (ref >60)
GFR, EST NON AFRICAN AMERICAN: 33 mL/min — AB (ref >60)
Glucose, Bld: 108 mg/dL — ABNORMAL HIGH (ref 65–99)
POTASSIUM: 4.7 mmol/L (ref 3.5–5.1)
Sodium: 143 mmol/L (ref 135–145)
TOTAL PROTEIN: 7.1 g/dL (ref 6.5–8.1)

## 2018-03-22 LAB — I-STAT TROPONIN, ED: Troponin i, poc: 0.02 ng/mL (ref 0.00–0.08)

## 2018-03-22 LAB — LIPASE, BLOOD: LIPASE: 41 U/L (ref 11–51)

## 2018-03-22 MED ORDER — CLONAZEPAM 0.5 MG PO TABS: 0.5000 mg | ORAL_TABLET | Freq: Every day | ORAL | Status: DC

## 2018-03-22 MED ORDER — BISACODYL 10 MG RE SUPP: 10.0000 mg | Freq: Every day | RECTAL | Status: DC | PRN

## 2018-03-22 MED ORDER — SODIUM CHLORIDE 0.9 % IV BOLUS
1000.0000 mL | Freq: Once | INTRAVENOUS | Status: AC
  Administered 2018-03-22: 1000 mL via INTRAVENOUS

## 2018-03-22 MED ORDER — ROSUVASTATIN CALCIUM 10 MG PO TABS
5.0000 mg | ORAL_TABLET | Freq: Every evening | ORAL | Status: DC
  Administered 2018-03-23 – 2018-03-27 (×5): 5 mg via ORAL
  Filled 2018-03-22 (×5): qty 1

## 2018-03-22 MED ORDER — HYDROCODONE-ACETAMINOPHEN 5-325 MG PO TABS: 1.0000 | ORAL_TABLET | Freq: Once | ORAL | Status: DC

## 2018-03-22 MED ORDER — METOPROLOL TARTRATE 5 MG/5ML IV SOLN
10.0000 mg | Freq: Once | INTRAVENOUS | Status: AC
  Administered 2018-03-22: 10 mg via INTRAVENOUS
  Filled 2018-03-22: qty 10

## 2018-03-22 MED ORDER — ENOXAPARIN SODIUM 30 MG/0.3ML ~~LOC~~ SOLN
30.0000 mg | SUBCUTANEOUS | Status: DC
  Administered 2018-03-22 – 2018-03-23 (×2): 30 mg via SUBCUTANEOUS
  Filled 2018-03-22 (×3): qty 0.3

## 2018-03-22 MED ORDER — OLANZAPINE 2.5 MG PO TABS: 2.5000 mg | ORAL_TABLET | Freq: Every evening | ORAL | Status: DC

## 2018-03-22 MED ORDER — CLOPIDOGREL BISULFATE 75 MG PO TABS: 75.0000 mg | ORAL_TABLET | Freq: Every day | ORAL | Status: DC

## 2018-03-22 MED ORDER — METOPROLOL TARTRATE 5 MG/5ML IV SOLN
5.0000 mg | Freq: Once | INTRAVENOUS | Status: AC
  Administered 2018-03-22: 5 mg via INTRAVENOUS
  Filled 2018-03-22: qty 5

## 2018-03-22 MED ORDER — HYDRALAZINE HCL 20 MG/ML IJ SOLN
10.0000 mg | Freq: Four times a day (QID) | INTRAMUSCULAR | Status: DC | PRN
  Administered 2018-03-23 (×2): 10 mg via INTRAVENOUS
  Filled 2018-03-22 (×2): qty 1

## 2018-03-22 MED ORDER — ACETAMINOPHEN 650 MG RE SUPP: 650.0000 mg | Freq: Four times a day (QID) | RECTAL | Status: DC | PRN

## 2018-03-22 MED ORDER — FLEET ENEMA 7-19 GM/118ML RE ENEM: 1.0000 | ENEMA | Freq: Once | RECTAL | Status: DC | PRN

## 2018-03-22 MED ORDER — THEOPHYLLINE ER 300 MG PO TB12: 150.0000 mg | ORAL_TABLET | Freq: Every day | ORAL | Status: DC

## 2018-03-22 MED ORDER — LOSARTAN POTASSIUM 25 MG PO TABS
100.0000 mg | ORAL_TABLET | Freq: Once | ORAL | Status: DC
  Filled 2018-03-22: qty 2

## 2018-03-22 MED ORDER — METOPROLOL TARTRATE 5 MG/5ML IV SOLN
10.0000 mg | Freq: Four times a day (QID) | INTRAVENOUS | Status: DC
  Administered 2018-03-22 – 2018-03-23 (×3): 10 mg via INTRAVENOUS
  Filled 2018-03-22 (×3): qty 10

## 2018-03-22 MED ORDER — CO-ENZYME Q-10 50 MG PO CAPS: 100.0000 mg | ORAL_CAPSULE | Freq: Every day | ORAL | Status: DC

## 2018-03-22 MED ORDER — ONDANSETRON HCL 4 MG/2ML IJ SOLN
4.0000 mg | Freq: Once | INTRAMUSCULAR | Status: AC
  Administered 2018-03-22: 4 mg via INTRAVENOUS
  Filled 2018-03-22: qty 2

## 2018-03-22 MED ORDER — FELODIPINE ER 5 MG PO TB24
5.0000 mg | ORAL_TABLET | Freq: Every day | ORAL | Status: DC
  Filled 2018-03-22: qty 1

## 2018-03-22 MED ORDER — KETOROLAC TROMETHAMINE 15 MG/ML IJ SOLN
15.0000 mg | Freq: Four times a day (QID) | INTRAMUSCULAR | Status: AC | PRN
  Administered 2018-03-25 – 2018-03-27 (×2): 15 mg via INTRAVENOUS
  Filled 2018-03-22 (×2): qty 1

## 2018-03-22 MED ORDER — CLONIDINE HCL 0.2 MG/24HR TD PTWK
0.2000 mg | MEDICATED_PATCH | TRANSDERMAL | Status: DC
  Administered 2018-03-22: 0.2 mg via TRANSDERMAL
  Filled 2018-03-22: qty 1

## 2018-03-22 MED ORDER — SODIUM CHLORIDE 0.9 % IV SOLN
INTRAVENOUS | Status: AC
  Administered 2018-03-22: 20:00:00 via INTRAVENOUS

## 2018-03-22 MED ORDER — ONDANSETRON HCL 4 MG/2ML IJ SOLN
4.0000 mg | Freq: Four times a day (QID) | INTRAMUSCULAR | Status: DC | PRN
  Administered 2018-03-23: 4 mg via INTRAVENOUS
  Filled 2018-03-22: qty 2

## 2018-03-22 MED ORDER — ACETAMINOPHEN 325 MG PO TABS: 650.0000 mg | ORAL_TABLET | Freq: Four times a day (QID) | ORAL | Status: DC | PRN

## 2018-03-22 MED ORDER — MOMETASONE FURO-FORMOTEROL FUM 200-5 MCG/ACT IN AERO
2.0000 | INHALATION_SPRAY | Freq: Two times a day (BID) | RESPIRATORY_TRACT | Status: DC
  Administered 2018-03-22 – 2018-03-28 (×11): 2 via RESPIRATORY_TRACT
  Filled 2018-03-22 (×2): qty 8.8

## 2018-03-22 MED ORDER — ONDANSETRON HCL 4 MG PO TABS: 4.0000 mg | ORAL_TABLET | Freq: Four times a day (QID) | ORAL | Status: DC | PRN

## 2018-03-22 MED ORDER — HYDRALAZINE HCL 20 MG/ML IJ SOLN: 10.0000 mg | Freq: Once | INTRAMUSCULAR | Status: DC

## 2018-03-22 NOTE — ED Notes (Signed)
Speech therapy at bedside for swallow evaluation, they recommend continuing NPO until further evaluation can be done tomorrow. Admitting provider at bedside at this time as well speaking with family

## 2018-03-22 NOTE — ED Provider Notes (Addendum)
[facility]ERGENCY DEPARTMENT Provider Note   CSN: [id number] Arrival date & time: 03/22/18  1029     History   Chief Complaint Chief Complaint  Patient presents with  . Dysphagia    HPI Erin Cox is a 84 y.o. female.  HPI   84 year old female with history of prior stroke, asthma, COPD, hypertension.  Presented to ED from home via transportation from neighbor for evaluation of choking sensation.  Patient report this morning she was eating breakfast which includes oatmeal.  After eating she developed choking sensation, vomited multiple times of white foamy spit.  She also complaining of pain to her lower back across her back.  She does endorse some shortness of breath which is not new.  She does have history of high blood pressure and when asked if she took her medication this morning, patient states "if I took it I have vomited up".  She denies any active chest pain, no fever chills.  Patient is a  poor historian, history difficult to obtain.  She does live at home by herself.  Past Medical History:  Diagnosis Date  . Asthma   . Carotid artery obstruction   . COPD (chronic obstructive pulmonary disease) ([place])   . Hypertension   . Stroke ([place])   . TIA (transient ischemic attack)     Patient Active Problem List   Diagnosis Date Noted  . Protein-calorie malnutrition, severe 02/13/2018  . Hyperlipidemia   . Carotid occlusion, left   . Ischemic stroke ([place]) 02/10/2018  . Acute metabolic encephalopathy 22/29/7989  . CKD (chronic kidney disease), stage III ([place]) 02/10/2018  . COPD (chronic obstructive pulmonary disease) ([place]) 02/10/2018  . Anxiety 02/10/2018  . Protein-calorie malnutrition, moderate ([place]) 02/10/2018  . Essential hypertension 02/10/2018  . TIA (transient ischemic attack)   . Carotid artery obstruction     Past Surgical History:  Procedure Laterality Date  . APPENDECTOMY       OB History   None      Home Medications     Prior to Admission medications   Medication Sig Start Date End Date Taking? Authorizing Provider  Ascorbic Acid (VITAMIN C) 1000 MG tablet Take 500 mg by mouth daily.     [provider]  aspirin EC 81 MG tablet Take 81 mg by mouth every evening.    [provider]  budesonide-formoterol (SYMBICORT) 160-4.5 MCG/ACT inhaler Inhale 2 puffs into the lungs 2 (two) times daily.    [provider]  Calcium Citrate (CITRACAL PO) Take 2 tablets by mouth daily.    [provider]  carbamazepine (TEGRETOL) 100 MG chewable tablet Chew 100 mg by mouth 3 (three) times daily as needed (for headaches).     [provider]  clonazePAM (KLONOPIN) 0.5 MG tablet Take 1 tablet (0.5 mg total) by mouth at bedtime. For anxiety 02/14/18   Cristal Ford, DO  cloNIDine (CATAPRES) 0.2 MG tablet Take 0.2 mg by mouth at bedtime.    [provider]  clopidogrel (PLAVIX) 75 MG tablet Take 75 mg by mouth daily.    [provider]  co-enzyme Q-10 50 MG capsule Take 2 capsules (100 mg total) by mouth daily. 02/14/18   Mikhail, Velta Addison, DO  felodipine (PLENDIL) 10 MG 24 hr tablet Take 5 mg by mouth daily.    [provider]  losartan (COZAAR) 100 MG tablet Take 100 mg by mouth daily.    [provider]  metoprolol succinate (TOPROL-XL) 25 MG 24  hr tablet Take 25 mg by mouth 2 (two) times daily.    [provider]  OLANZapine (ZYPREXA) 2.5 MG tablet  03/11/18   [provider]  OLANZapine (ZYPREXA) 5 MG tablet Take 2.5 mg by mouth at bedtime.    [provider]  potassium gluconate 595 MG TABS Take 595 mg by mouth daily.    [provider]  rosuvastatin (CRESTOR) 5 MG tablet Take 1 tablet (5 mg total) by mouth daily at 6 PM. 02/14/18   Cristal Ford, DO  theophylline (THEODUR) 300 MG 12 hr tablet Take 150 mg by mouth daily.    [provider]  UNABLE TO FIND Magic cup daily    [provider]   vitamin B-12 (CYANOCOBALAMIN) 1000 MCG tablet Take 1,000 mcg by mouth daily.    [provider]  vitamin E 400 UNIT capsule Take 400 Units by mouth daily.    [provider]    Family History Family History  Problem Relation Age of Onset  . Hypertension Mother   . Heart attack Father     Social History Social History   Tobacco Use  . Smoking status: Never Smoker  . Smokeless tobacco: Never Used  Substance Use Topics  . Alcohol use: No  . Drug use: No     Allergies   Lipitor [atorvastatin]; Other; Penicillins; Statins; Sulfa antibiotics; and Iodine   Review of Systems Review of Systems  All other systems reviewed and are negative.    Physical Exam Updated Vital Signs BP (!) 170/94   Pulse (!) 114   Temp 98.6 F (37 C) (Oral)   Resp (!) 21   Ht 5' (1.524 m)   Wt 37.6 kg (83 lb)   SpO2 94%   BMI 16.21 kg/m   Physical Exam  Constitutional: She is oriented to person, place, and time. No distress.  Frail appearing elderly female, appears mildly uncomfortable but nontoxic.  HENT:  Head: Atraumatic.  Mouth/Throat: Oropharynx is clear and moist.  No stridor  Eyes: Conjunctivae are normal.  Neck: Normal range of motion. Neck supple. No JVD present. No tracheal deviation present. No thyromegaly present.  Cardiovascular: Intact distal pulses.  Tachycardia without murmur rubs or gallops  Pulmonary/Chest:  Mild tachypnea, crackles heard at lung base.  No wheezes.  Abdominal: Soft. She exhibits no distension. There is no tenderness.  No abdominal bruit, no pulsatile mass.  Musculoskeletal: She exhibits tenderness (Tenderness to lumbar paraspinal muscle on palpation without any significant midline spine tenderness crepitus or step-off.).  Lymphadenopathy:    She has no cervical adenopathy.  Neurological: She is alert and oriented to person, place, and time.  Skin: No rash noted.  Psychiatric: She has a normal mood and affect.  Nursing note and  vitals reviewed.    ED Treatments / Results  Labs (all labs ordered are listed, but only abnormal results are displayed) Labs Reviewed  COMPREHENSIVE METABOLIC PANEL - Abnormal; Notable for the following components:      Result Value   Glucose, Bld 108 (*)    BUN 34 (*)    Creatinine, Ser 1.34 (*)    GFR calc non Af Amer 33 (*)    GFR calc Af Amer 38 (*)    All other components within normal limits  CBC WITH DIFFERENTIAL/PLATELET  LIPASE, BLOOD  I-STAT TROPONIN, ED    EKG EKG Interpretation  Date/Time:  Tuesday Mar 22 2018 10:40:36 EDT Ventricular Rate:  107 PR Interval:    QRS  Duration: 90 QT Interval:  342 QTC Calculation: 457 R Axis:   -71 Text Interpretation:  Atrial fibrillation Left anterior fascicular block RSR' in V1 or V2, right VCD or RVH Consider left ventricular hypertrophy Abnrm T, consider ischemia, anterolateral lds ST elevation, consider inferior injury similar to previous Confirmed by Theotis Burrow [id number]) on 03/22/2018 11:29:08 AM   Radiology Dg Lumbar Spine Complete  Result Date: 03/22/2018 CLINICAL DATA:  Fall.  Back pain. EXAM: LUMBAR SPINE - COMPLETE 4+ VIEW COMPARISON:  CT 02/14/2012.  Abdomen series 02/14/2012. FINDINGS: Diffuse osteopenia. Multilevel degenerative change lumbar spine and both hips. Multiple mild to moderate thoraco lumbar spine compression fractures are again noted. Similar findings noted on prior exams. If symptoms persist and a superimposed acute injury is suspected MRI can be obtained. IMPRESSION: 1. Multiple mild-to-moderate thoracolumbar spine compression fractures again noted. Similar findings noted on prior exam. If symptoms persist and a superimposed acute injury remains suspected MRI can be obtained. 2.  Diffuse osteopenia degenerative change again noted. Electronically Signed   By: Marcello Moores  Register   On: 03/22/2018 13:47   Dg Elbow Complete Left  Result Date: 03/22/2018 CLINICAL DATA:  [place] with bruising of the left elbow  EXAM: LEFT ELBOW - COMPLETE 3+ VIEW COMPARISON:  None. FINDINGS: A true lateral view of the left elbow was not obtained but no definite joint effusion is seen on the images obtained. The bones appear somewhat osteopenic. Alignment is normal. Joint spaces appear normal. No fracture is noted. IMPRESSION: No definite fracture or effusion.  Osteopenia. Electronically Signed   By: Ivar Drape M.D.   On: 03/22/2018 13:46   Dg Chest Portable 1 View  Result Date: 03/22/2018 CLINICAL DATA:  Choking sensation.  Difficulty swallowing. EXAM: PORTABLE CHEST 1 VIEW COMPARISON:  Two-view chest x-ray 02/10/2018.  CTA chest 02/02/2013. FINDINGS: The heart is enlarged. Atherosclerotic changes are present at the aorta. Emphysematous changes are present in the lungs bilaterally. There is scarring at both lung apices. No edema or effusion is present. There is no evidence for aspiration or focal airspace disease. IMPRESSION: 1. No acute cardiopulmonary disease. 2.  Aortic Atherosclerosis (ICD10-I70.0). 3.  Emphysema (ICD10-J43.9). Electronically Signed   By: San Morelle M.D.   On: 03/22/2018 11:05    Procedures Procedures (including critical care time)  Medications Ordered in ED Medications  losartan (COZAAR) tablet 100 mg (100 mg Oral Not Given 03/22/18 1255)  felodipine (PLENDIL) 24 hr tablet 5 mg (5 mg Oral Not Given 03/22/18 1256)  HYDROcodone-acetaminophen (NORCO/VICODIN) 5-325 MG per tablet 1 tablet (0 tablets Oral Hold 03/22/18 1556)  ondansetron (ZOFRAN) injection 4 mg (4 mg Intravenous Given 03/22/18 1157)  metoprolol tartrate (LOPRESSOR) injection 10 mg (10 mg Intravenous Given 03/22/18 1241)  sodium chloride 0.9 % bolus 1,000 mL (1,000 mLs Intravenous New Bag/Given 03/22/18 1444)  metoprolol tartrate (LOPRESSOR) injection 5 mg (5 mg Intravenous Given 03/22/18 1603)     Initial Impression / Assessment and Plan / ED Course  I have reviewed the triage vital signs and the nursing notes.  Pertinent labs &  imaging results that were available during my care of the patient were reviewed by me and considered in my medical decision making (see chart for details).     BP (!) 217/117 (BP Location: Right Arm)   Pulse (!) 107   Temp 98.6 F (37 C) (Oral)   Resp 20   Ht 5' (1.524 m)   Wt 37.6 kg (83 lb)   SpO2 98%   BMI 16.21  kg/m    Final Clinical Impressions(s) / ED Diagnoses   Final diagnoses:  Other dysphagia  Chronic low back pain without sciatica, unspecified back pain laterality    ED Discharge Orders        Ordered    DG Swallowing Func-Speech Pathology     03/22/18 1357    SLP modified barium swallow     03/22/18 1357     11:02 AM Patient here with choking sensation after eating breakfast which includes oatmeal.  She has normal phonation on exam, but currently spitting out white foam as emesis.  No stridor.  Has tenderness to her low back but no chest pain no abdominal pain.  No abdominal bruit or pulsatile mass to suggest aortic dissection.  She has intact distal pedal pulses.  She is found to be hypertensive with blood pressure in the 916B systolic as well as tachycardic.  She was unable to keep her blood pressure medication down this morning.  We will give IV fluids, blood pressure medication, antinausea medication, and work-up initiated.  Care discussed with Dr. Rex Kras.   11:34 AM Per chart review, patient suffered a right pontine infarction on 02/10/2018.  Since the stroke, per note, patient's granddaughter have concerns for dysphasia and consistent coughing choking on food.  Patient is currently eating pure and nectar thick liquid diet.  She has also had decrease in appetite and has been losing weight since her stroke.  11:53 AM Initial chest x-ray shows no acute finding.  Evidence of aortic atherosclerosis and emphysema which has unchanged from prior.  No evidence of aspiration or focal airspace disease.  I did consult with [facility] hospitalist in regard to this patient's  symptoms.  The recommendation is to reach out to social worker to help arrange outpatient stroke swallow screen by physical therapist but otherwise patient does not meet admission criteria.  Since patient is hypertensive, plan to give patient her daily dose of blood pressure medication.  Will monitor closely.  12:22 PM Family members are at bedside.  They report the patient has been having complaint of low back for the past 4 days.  No injury of note.  However, it has affected her movement therefore I will obtain x-ray of L-spine for further evaluation.  Patient has bruising noted to the left elbow at the epicondyle, normal range of motion on exam.  No deformity.  X-ray ordered.  She has a peripheral IV placed in her left wrist dorsally with signs of infiltration which include swelling bruising distally.  She is able to range her wrist.  Ice pack placed.  Labs remarkable for impaired renal functions with BUN 34, creatinine 1.34.  She is currently receiving IV fluid.  2:52 PM L-spine x-ray demonstrate multiple mild to moderate thoracolumbar spine compression fracture similar to prior exam.  I discussed this finding with patient and family member who is at bedside.  They voiced concern for new fracture because patient now complaining of back pain.  Although MRI is recommended on x-ray results, after discussion, will obtain L-spine CT for further evaluation.  4:01 PM Pt with decreased fluid intake due to dysphagia.  IVF given.  She is unable to take her medication at this time.  After discussion with family member and with patient, I have consultted [facility], Dyanne Carrel, NP who agrees to see and admit pt for further management of her condition.  Repeat head ct and Lspine CT is currently pending.    Domenic Moras, PA-C 03/22/18 1622  Little, Wenda Overland, MD 03/23/18 [phone]  ADDENDUM: CC time added CRITICAL CARE Performed by: Domenic Moras Total critical care time: 35 minutes Critical care  time was exclusive of separately billable procedures and treating other patients. Critical care was necessary to treat or prevent imminent or life-threatening deterioration. Critical care was time spent personally by me on the following activities: development of treatment plan with patient and/or surrogate as well as nursing, discussions with consultants, evaluation of patient's response to treatment, examination of patient, obtaining history from patient or surrogate, ordering and performing treatments and interventions, ordering and review of laboratory studies, ordering and review of radiographic studies, pulse oximetry and re-evaluation of patient's condition.    Domenic Moras, PA-C 03/30/18 0919    Little, Wenda Overland, MD 03/30/18 [phone]

## 2018-03-22 NOTE — H&P (Signed)
History and Physical    Erin Cox NFA:[id number] DOB: 01-Jun-1921 DOA: 03/22/2018  PCP: [name], MD Patient coming from: home  Chief Complaint: back pain, generalized weakness, inability to swallow  HPI: Erin Cox is a delightful 84 y.o. female with medical history significant for atrial fibrillation, COPD not on home oxygen, prior stroke, hypertension,chronic kidney disease,severe protein calorie malnutrition,presents to the emergency department chief complaint generalized weakness and difficulty swallowing. Initial evaluation reveals uncontrolled blood pressure and includes evaluation of speech therapy who recommends nothing by mouth admission for further swallow evaluation.  Information is obtained from the granddaughters and the patient. Patient was recently discharged from his left home where she has 24 7 care. Patient reports this morning she was eating oatmeal and she became choked.She coughed and "gagged" and coughed up white foamy secretions. Also began to complain of pain in her low back. She denies chest pain palpitation shortness of breath. She denies nauseaabdominal pain headache dizziness syncope or near-syncope. He does note that she has had difficulties with swallowing and her granddaughters confirm since her recent hospitalization.family reports during that hospitalization she was evaluated by speech therapy and they recommended a thickening agent for liquids. Knee also reports this morning patient had difficulty bearing weight and complained of low back pain. Her baseline is independent with walker is able to get up and go off of chair couch toilet. Able to ambulate with walker. No recent falls. She denies dysuria hematuria frequency or urgency. She does endorse a decreased oral intake because she has frequent episodes of "choking". She denies diarrhea constipation melena bright red blood per rectum.    ED Course: in the emergency department she is hypertensive EKG  reveals A. Fib with a rate 110 and not hypoxic she is afebrile nontoxic appearing. She is provided with metoprolol 2. He is evaluated at the bedside by speech therapy as noted above.  Review of Systems: As per HPI otherwise all other systems reviewed and are negative.   Ambulatory Status: set home alone but does have 24/7 care ambulates with a walker  Past Medical History:  Diagnosis Date  . Asthma   . Back pain   . Carotid artery obstruction   . Compression fracture of lumbar spine, non-traumatic ([place])   . COPD (chronic obstructive pulmonary disease) ([place])   . Hypertension   . Stroke ([place])   . TIA (transient ischemic attack)     Past Surgical History:  Procedure Laterality Date  . APPENDECTOMY      Social History   Socioeconomic History  . Marital status: Widowed    Spouse name: Not on file  . Number of children: Not on file  . Years of education: Not on file  . Highest education level: Not on file  Occupational History  . Not on file  Social Needs  . Financial resource strain: Not on file  . Food insecurity:    Worry: Not on file    Inability: Not on file  . Transportation needs:    Medical: Not on file    Non-medical: Not on file  Tobacco Use  . Smoking status: Never Smoker  . Smokeless tobacco: Never Used  Substance and Sexual Activity  . Alcohol use: No  . Drug use: No  . Sexual activity: Not on file  Lifestyle  . Physical activity:    Days per week: Not on file    Minutes per session: Not on file  . Stress: Not on file  Relationships  .  Social connections:    Talks on phone: Not on file    Gets together: Not on file    Attends religious service: Not on file    Active member of club or organization: Not on file    Attends meetings of clubs or organizations: Not on file    Relationship status: Not on file  . Intimate partner violence:    Fear of current or ex partner: Not on file    Emotionally abused: Not on file    Physically abused: Not on file      Forced sexual activity: Not on file  Other Topics Concern  . Not on file  Social History Narrative  . Not on file    Allergies  Allergen Reactions  . Lipitor [Atorvastatin] Other (See Comments)    Muscle contractions and fuzzy feeling  . Other Hives and Swelling    Reaction to walnuts  . Penicillins Hives    Has patient had a PCN reaction causing immediate rash, facial/tongue/throat swelling, SOB or lightheadedness with hypotension: Yes Has patient had a PCN reaction causing severe rash involving mucus membranes or skin necrosis: No Has patient had a PCN reaction that required hospitalization: No Has patient had a PCN reaction occurring within the last 10 years: Yes If all of the above answers are "NO", then may proceed with Cephalosporin use.  . Statins Other (See Comments)    Muscle contractions and fuzzy feeling  . Sulfa Antibiotics Other (See Comments)    Made asthma worse  . Iodine Rash    Family History  Problem Relation Age of Onset  . Hypertension Mother   . Heart attack Father     Prior to Admission medications   Medication Sig Start Date End Date Taking? Authorizing Provider  Ascorbic Acid (VITAMIN C) 1000 MG tablet Take 500 mg by mouth daily.    Yes [provider]  aspirin EC 81 MG tablet Take 81 mg by mouth every evening.   Yes [provider]  budesonide-formoterol (SYMBICORT) 160-4.5 MCG/ACT inhaler Inhale 2 puffs into the lungs 2 (two) times daily.   Yes [provider]  Calcium Citrate (CITRACAL PO) Take 2 tablets by mouth daily.   Yes [provider]  carbamazepine (TEGRETOL) 100 MG chewable tablet Chew 100 mg by mouth 3 (three) times daily as needed (for headaches).    Yes [provider]  clonazePAM (KLONOPIN) 0.5 MG tablet Take 1 tablet (0.5 mg total) by mouth at bedtime. For anxiety 02/14/18  Yes Mikhail, [place], DO  cloNIDine (CATAPRES) 0.2 MG tablet Take 0.2 mg by mouth at bedtime.   Yes [provider]  clopidogrel (PLAVIX) 75 MG tablet Take 75 mg by mouth daily.   Yes [provider]  co-enzyme Q-10 50 MG capsule Take 2 capsules (100 mg total) by mouth daily. 02/14/18  Yes Mikhail, Velta Addison, DO  felodipine (PLENDIL) 5 MG 24 hr tablet Take 5 mg by mouth daily.    Yes [provider]  losartan (COZAAR) 100 MG tablet Take 100 mg by mouth daily.   Yes [provider]  metoprolol succinate (TOPROL-XL) 25 MG 24 hr tablet Take 25 mg by mouth 2 (two) times daily.   Yes [provider]  OLANZapine (ZYPREXA) 2.5 MG tablet Take 2.5 mg by mouth every evening.  03/11/18  Yes [provider]  potassium gluconate 595 MG TABS Take 595 mg by mouth daily.   Yes [provider]  rosuvastatin (CRESTOR) 5 MG tablet  Take 1 tablet (5 mg total) by mouth daily at 6 PM. Patient taking differently: Take 5 mg by mouth every evening. 5pm 02/14/18  Yes Mikhail, Velta Addison, DO  theophylline (THEODUR) 300 MG 12 hr tablet Take 150 mg by mouth daily.   Yes [provider]  vitamin B-12 (CYANOCOBALAMIN) 1000 MCG tablet Take 1,000 mcg by mouth daily.   Yes [provider]  vitamin E 400 UNIT capsule Take 400 Units by mouth daily.   Yes [provider]    Physical Exam: Vitals:   03/22/18 1415 03/22/18 1430 03/22/18 1515 03/22/18 1607  BP: (!) 176/118 (!) 191/108 (!) 170/94 (!) 185/96  Pulse: (!) 110 (!) 114 (!) 114 (!) 106  Resp: 20 19 (!) 21 17  Temp:      TempSrc:      SpO2: 95% 95% 94% 94%  Weight:      Height:         General:  Appears , frail cachectic in no acute distress Eyes:  PERRL, EOMI, normal lids, iris ENT:  grossly normal hearing, lips & tongue, mucous membranes of her mouth are moist and pink Neck:  no LAD, masses or thyromegaly Cardiovascular:  Irregularly irregularno m/r/g. No LE edema.  Respiratory:  CTA bilaterally, no w/r/r. Normal respiratory effort. Abdomen:  soft, ntnd, positive bowel sounds no guarding  or rebounding Skin:  no rash or induration seen on limited exam Musculoskeletal:  grossly normal tone BUE/BLE, good ROM, no bony abnormality joints without swelling/erythema straight leg left bilaterally without pain. lumbar Spine nontender Psychiatric:  grossly normal mood and affect, speech fluent and appropriate, AOx3 Neurologic:  CN 2-12 grossly intact, moves all extremities in coordinated fashion, sensation intact  Labs on Admission: I have personally reviewed following labs and imaging studies  CBC: Recent Labs  Lab 03/22/18 1119  WBC 8.0  NEUTROABS 5.6  HGB 13.6  HCT 43.7  MCV 95.0  PLT 426   Basic Metabolic Panel: Recent Labs  Lab 03/22/18 1119  NA 143  K 4.7  CL 102  CO2 30  GLUCOSE 108*  BUN 34*  CREATININE 1.34*  CALCIUM 10.0   GFR: Estimated Creatinine Clearance: 15.2 mL/min (A) (by C-G formula based on SCr of 1.34 mg/dL (H)). Liver Function Tests: Recent Labs  Lab 03/22/18 1119  AST 22  ALT 15  ALKPHOS 94  BILITOT 0.7  PROT 7.1  ALBUMIN 3.8   Recent Labs  Lab 03/22/18 1119  LIPASE 41   No results for input(s): AMMONIA in the last 168 hours. Coagulation Profile: No results for input(s): INR, PROTIME in the last 168 hours. Cardiac Enzymes: No results for input(s): CKTOTAL, CKMB, CKMBINDEX, TROPONINI in the last 168 hours. BNP (last 3 results) No results for input(s): PROBNP in the last 8760 hours. HbA1C: No results for input(s): HGBA1C in the last 72 hours. CBG: No results for input(s): GLUCAP in the last 168 hours. Lipid Profile: No results for input(s): CHOL, HDL, LDLCALC, TRIG, CHOLHDL, LDLDIRECT in the last 72 hours. Thyroid Function Tests: No results for input(s): TSH, T4TOTAL, FREET4, T3FREE, THYROIDAB in the last 72 hours. Anemia Panel: No results for input(s): VITAMINB12, FOLATE, FERRITIN, TIBC, IRON, RETICCTPCT in the last 72 hours. Urine analysis:    Component Value Date/Time   COLORURINE YELLOW 02/10/2018 [place] 02/10/2018 1219   LABSPEC 1.013 02/10/2018 1219   PHURINE 7.0 02/10/2018 1219   GLUCOSEU NEGATIVE 02/10/2018 1219   HGBUR NEGATIVE 02/10/2018 1219   BILIRUBINUR NEGATIVE  02/10/2018 1219   [place] 02/10/2018 1219   PROTEINUR 30 (A) 02/10/2018 1219   UROBILINOGEN 0.2 02/14/2012 0736   NITRITE NEGATIVE 02/10/2018 1219   LEUKOCYTESUR NEGATIVE 02/10/2018 1219    Creatinine Clearance: Estimated Creatinine Clearance: 15.2 mL/min (A) (by C-G formula based on SCr of 1.34 mg/dL (H)).  Sepsis Labs: @LABRCNTIP (procalcitonin:4,lacticidven:4) )No results found for this or any previous visit (from the past 240 hour(s)).   Radiological Exams on Admission: Dg Lumbar Spine Complete  Result Date: 03/22/2018 CLINICAL DATA:  Fall.  Back pain. EXAM: LUMBAR SPINE - COMPLETE 4+ VIEW COMPARISON:  CT 02/14/2012.  Abdomen series 02/14/2012. FINDINGS: Diffuse osteopenia. Multilevel degenerative change lumbar spine and both hips. Multiple mild to moderate thoraco lumbar spine compression fractures are again noted. Similar findings noted on prior exams. If symptoms persist and a superimposed acute injury is suspected MRI can be obtained. IMPRESSION: 1. Multiple mild-to-moderate thoracolumbar spine compression fractures again noted. Similar findings noted on prior exam. If symptoms persist and a superimposed acute injury remains suspected MRI can be obtained. 2.  Diffuse osteopenia degenerative change again noted. Electronically Signed   By: Marcello Moores  Register   On: 03/22/2018 13:47   Dg Elbow Complete Left  Result Date: 03/22/2018 CLINICAL DATA:  [place] with bruising of the left elbow EXAM: LEFT ELBOW - COMPLETE 3+ VIEW COMPARISON:  None. FINDINGS: A true lateral view of the left elbow was not obtained but no definite joint effusion is seen on the images obtained. The bones appear somewhat osteopenic. Alignment is normal. Joint spaces appear normal. No fracture is noted. IMPRESSION: No definite fracture or  effusion.  Osteopenia. Electronically Signed   By: Ivar Drape M.D.   On: 03/22/2018 13:46   Dg Chest Portable 1 View  Result Date: 03/22/2018 CLINICAL DATA:  Choking sensation.  Difficulty swallowing. EXAM: PORTABLE CHEST 1 VIEW COMPARISON:  Two-view chest x-ray 02/10/2018.  CTA chest 02/02/2013. FINDINGS: The heart is enlarged. Atherosclerotic changes are present at the aorta. Emphysematous changes are present in the lungs bilaterally. There is scarring at both lung apices. No edema or effusion is present. There is no evidence for aspiration or focal airspace disease. IMPRESSION: 1. No acute cardiopulmonary disease. 2.  Aortic Atherosclerosis (ICD10-I70.0). 3.  Emphysema (ICD10-J43.9). Electronically Signed   By: San Morelle M.D.   On: 03/22/2018 11:05    EKG: Independently reviewed. Atrial fibrillation Left anterior fascicular block RSR' in V1 or V2, right VCD or RVH Consider left ventricular hypertrophy Abnrm T, consider ischemia, anterolateral lds ST elevation, consider inferior injury  Assessment/Plan Principal Problem:   Dysphagia Active Problems:   CKD (chronic kidney disease), stage III (HCC)   COPD (chronic obstructive pulmonary disease) (HCC)   Essential hypertension   Compression fracture of lumbar spine, non-traumatic (HCC)   Back pain   Anxiety   Atrial fibrillation ([place])   #1. Dysphagia. Etiology uncertain. However she did have a recent stroke was evaluated by speech therapy who recommended liquid thickener at that time. Family reports continued admit and "choking" with eating and drinking. He is been unable to take her medicines that include antihypertensives and rate control agents. Evaluated by speech therapy in the emergency department who recommended nothing by mouth and admission for further evaluation -Admit -Nothing by mouth -Appreciate speech therapy assistance  #2. generalized weakness/compression fracture of the lumbar spine. patient unable to bear  weight as of this morning. No recent trauma.chest x-ray with no cardiopulmonary process.x-ray of the lumbar spine shows multiple mild to moderate thoraco  lumbar spine compression fractures similar to previous study. Diffuse osteopenia.  -obtained a CT of the head -Obtain CT of the lumbar spine -Physical therapy -Supportive care  #3. Atrial fibrillation. [place] score 5. EKG as noted above. Home medications includePlavix Toprol  -Holding Plavix for now secondary to above -IV metoprolol with parameters -Monitor  #4. Hypertension. Poor control in the emergency department. Patient has been unable to take her antihypertensive medications. They include clonidine,plendil, etoprolol, losartan -We'll convert clonidine to a patch -iV metoprolol as noted above -When necessary hydralazine  #5. COPD. Not on home oxygen. Stable at baseline. Oxygen saturation level greater than 90% on room air. Chest x-ray without a cardiopulmonary process. Home medications include Symbicort and theophylline. -Continue Symbicort -Monitor  #6. Chronic kidney disease. Stage III. Creatinine 1.3 on admission. -Hold nephrotoxins -gentle IV fluids -Monitor urine output -Recheck in the morning   DVT prophylaxis: scd  Code Status: dnr  Family Communication: granddaughters  Disposition Plan: may need snf  Consults called: speech therapy  Admission status: inpatient    Radene Gunning MD [facility]  If 7PM-7AM, please contact night-coverage [email or web address] Password [facility]  03/22/2018, 5:05 PM

## 2018-03-22 NOTE — ED Notes (Signed)
Patient transported to x-ray. ?

## 2018-03-22 NOTE — ED Triage Notes (Signed)
Patient is 1 month post CVA , 2 weeks post discharge from nursing home, caretaker states patient was having increased difficulty with swallowing this am. Was able to get breakfast and take one of her pills . Patient is able to speak incomplete sentences. Able to maintain her salvia. C/o cough , lungs are clear.

## 2018-03-22 NOTE — ED Notes (Signed)
PAGED KIRBY TO Judson Roch, RN

## 2018-03-22 NOTE — Discharge Planning (Signed)
the [facility] placed [facility] order for Swallow evaluation post discharge.

## 2018-03-22 NOTE — Evaluation (Signed)
Clinical/Bedside Swallow Evaluation Patient Details  Name: Erin Cox MRN: [id number] Date of Birth: 1921/07/13  Today's Date: 03/22/2018 Time: SLP Start Time (ACUTE ONLY): 65 SLP Stop Time (ACUTE ONLY): 1640 SLP Time Calculation (min) (ACUTE ONLY): 30 min  Past Medical History:  Past Medical History:  Diagnosis Date  . Asthma   . Back pain   . Carotid artery obstruction   . Compression fracture of lumbar spine, non-traumatic ([place])   . COPD (chronic obstructive pulmonary disease) ([place])   . Hypertension   . Stroke ([place])   . TIA (transient ischemic attack)    Past Surgical History:  Past Surgical History:  Procedure Laterality Date  . APPENDECTOMY     HPI:  84 year old female with history of prior stroke, asthma, COPD, hypertension. Presented to ED from home via transportation from neighbor for evaluation of choking sensation. Daughters present and report pt choked on breakfast this morning, vomited multiple times of white foamy spit and inability to stand after choking. She also complaining of pain to her lower back across her back. She does endorse some shortness of breath which is not new. She does have history of high bloodpressure and when asked if she took her medication this morning, patient states "if I took it I have vomited up". She denies any active chest pain, no fever chills. Pt with recent admission for 01/2018 with MRI (4/18) revealed a moderate-sized acute nonhemorrhagic pontine infarct. Wide patency of the adjacent basilar artery suggests small-vessel insult, advanced atrophy, and chronic appearing occlusion left ICA. Per daughters' report, pt discharged to [facility] on 02/14/18 had aspiration event that led to aspiration PNA on 02/16/18 with diet downgraded at bedside to puree with nectar thick liquids. Per report, pt continues with coughing on purees and nectar with facility planned instrumental swallow study but pt discharged home before facility could schedule. She  discharged home on 5/16 with 24 hour care givers and continued overt s/s of aspiration with puree and nectar thick liquids (coughing per daughters' report).    Assessment / Plan / Recommendation Clinical Impression   Pt appears at high risk of aspriation given history reported by family members, recent aspiration PNA at SNF, diet downgrade at bedside at SNF, as well as advanced age, dehydartion and frail appearance. During this BSE, pt able to initiate swallow with significant tongue pumping and then vomiting of forthy secretions. Unable to recommend safe diet at bedside, recommend NPO with ST to follow on next date with instrumental study to assess safety of PO intake SLP Visit Diagnosis: Dysphagia, oropharyngeal phase (R13.12)    Aspiration Risk  Severe aspiration risk;Risk for inadequate nutrition/hydration    Diet Recommendation NPO   Medication Administration: Via alternative means    Other  Recommendations Oral Care Recommendations: Oral care QID Other Recommendations: Remove water pitcher;Have oral suction available   Follow up Recommendations Skilled Nursing facility      Frequency and Duration min 2x/week  2 weeks       Prognosis Prognosis for Safe Diet Advancement: Fair Barriers to Reach Goals: Severity of deficits;Time post onset      Swallow Study   General Date of Onset: 03/22/18 HPI: 84 year old female with history of prior stroke, asthma, COPD, hypertension. Presented to ED from home via transportation from neighbor for evaluation of choking sensation. Daughters present and report pt choked on breakfast this morning, vomited multiple times of white foamy spit and inability to stand after choking. She also complaining of pain to her lower back across  her back. She does endorse some shortness of breath which is not new. She does have history of high bloodpressure and when asked if she took her medication this morning, patient states "if I took it I have vomited up".  She denies any active chest pain, no fever chills. Pt with recent admission for 01/2018 with MRI (4/18) revealed a moderate-sized acute nonhemorrhagic pontine infarct. Wide patency of the adjacent basilar artery suggests small-vessel insult, advanced atrophy, and chronic appearing occlusion left ICA. Per daughters' report, pt discharged to [facility] on 02/14/18 had aspiration event that led to aspiration PNA on 02/16/18 with diet downgraded at bedside to puree with nectar thick liquids. Per report, pt continues with coughing on purees and nectar with facility planned instrumental swallow study but pt discharged home before facility could schedule. She discharged home on 5/16 with 24 hour care givers and continued overt s/s of aspiration with puree and nectar thick liquids (coughing per daughters' report).  Type of Study: Bedside Swallow Evaluation Previous Swallow Assessment: none in chart Diet Prior to this Study: NPO Temperature Spikes Noted: No Respiratory Status: Room air History of Recent Intubation: No Behavior/Cognition: Alert;Cooperative Oral Cavity Assessment: Within Functional Limits Oral Care Completed by SLP: No Oral Cavity - Dentition: Adequate natural dentition Patient Positioning: Upright in bed Baseline Vocal Quality: Low vocal intensity Volitional Cough: Cognitively unable to elicit Volitional Swallow: Able to elicit    Oral/Motor/Sensory Function Overall Oral Motor/Sensory Function: Within functional limits   Ice Chips Ice chips: Not tested   Thin Liquid Thin Liquid: Not tested    Nectar Thick Nectar Thick Liquid: Not tested   Honey Thick Honey Thick Liquid: Not tested   Puree Puree: Not tested   Solid   GO   Solid: Not tested        Erin Cox 03/22/2018,4:58 PM

## 2018-03-22 NOTE — ED Notes (Signed)
Pt in room, alert and family @ bedside. VS documented

## 2018-03-22 NOTE — ED Notes (Signed)
Family  At bedside  Noticed bruising to left hand . Large hematoma to hand wasn't there when IV was started eariler, IV not infiltrated IV flushes well and pulls back. Family also notice several small bubbled up areas on her left are appears like blood blisters. No break in the skin. No additional IV attempts or lab attempts. Ice pack applied and MD aware.. Patient has a wound on her right forearm that daughter states happened at home . Home health nurse is dressing. IV team consulted to look at IV site.

## 2018-03-22 NOTE — Care Management (Signed)
Patient is no longer at [facility] SNF discharged on 5/16 with [facility] services.  Patient will resume services once medically cleared for care transitioning.

## 2018-03-22 NOTE — Progress Notes (Addendum)
CSW called [facility]. Pt was a rehab pt. Pt discharged from [facility] on 03/10/18. Pt discharged to home with home health from [facility]. Representative from [facility] unsure with what home health agency she was set up with.   CSW will follow up with family.   Update: Pt is with [facility]. Pt's family hired 24/7 personal care agents. Pt's family expressed concerns about pt not being able to walk starting yesterday. Pt had been walking independently with walker until yesterday. Pt is being admitted.   Wendelyn Breslow, [facility]  [phone]

## 2018-03-22 NOTE — ED Notes (Signed)
Pt put on Purewick at 11:30.

## 2018-03-23 ENCOUNTER — Other Ambulatory Visit: Payer: Self-pay

## 2018-03-23 ENCOUNTER — Observation Stay (HOSPITAL_COMMUNITY): Payer: Medicare Other

## 2018-03-23 DIAGNOSIS — Z7902 Long term (current) use of antithrombotics/antiplatelets: Secondary | ICD-10-CM | POA: Diagnosis not present

## 2018-03-23 DIAGNOSIS — I129 Hypertensive chronic kidney disease with stage 1 through stage 4 chronic kidney disease, or unspecified chronic kidney disease: Secondary | ICD-10-CM | POA: Diagnosis present

## 2018-03-23 DIAGNOSIS — R1310 Dysphagia, unspecified: Secondary | ICD-10-CM | POA: Diagnosis not present

## 2018-03-23 DIAGNOSIS — Z66 Do not resuscitate: Secondary | ICD-10-CM | POA: Diagnosis present

## 2018-03-23 DIAGNOSIS — Z681 Body mass index (BMI) 19 or less, adult: Secondary | ICD-10-CM | POA: Diagnosis not present

## 2018-03-23 DIAGNOSIS — M545 Low back pain: Secondary | ICD-10-CM | POA: Diagnosis not present

## 2018-03-23 DIAGNOSIS — R0602 Shortness of breath: Secondary | ICD-10-CM | POA: Diagnosis not present

## 2018-03-23 DIAGNOSIS — R1319 Other dysphagia: Secondary | ICD-10-CM | POA: Diagnosis not present

## 2018-03-23 DIAGNOSIS — N183 Chronic kidney disease, stage 3 (moderate): Secondary | ICD-10-CM | POA: Diagnosis present

## 2018-03-23 DIAGNOSIS — R627 Adult failure to thrive: Secondary | ICD-10-CM | POA: Diagnosis present

## 2018-03-23 DIAGNOSIS — M4854XA Collapsed vertebra, not elsewhere classified, thoracic region, initial encounter for fracture: Secondary | ICD-10-CM | POA: Diagnosis present

## 2018-03-23 DIAGNOSIS — J449 Chronic obstructive pulmonary disease, unspecified: Secondary | ICD-10-CM | POA: Diagnosis present

## 2018-03-23 DIAGNOSIS — G8929 Other chronic pain: Secondary | ICD-10-CM | POA: Diagnosis present

## 2018-03-23 DIAGNOSIS — I69391 Dysphagia following cerebral infarction: Secondary | ICD-10-CM | POA: Diagnosis not present

## 2018-03-23 DIAGNOSIS — F419 Anxiety disorder, unspecified: Secondary | ICD-10-CM | POA: Diagnosis not present

## 2018-03-23 DIAGNOSIS — Z515 Encounter for palliative care: Secondary | ICD-10-CM | POA: Diagnosis not present

## 2018-03-23 DIAGNOSIS — Z7982 Long term (current) use of aspirin: Secondary | ICD-10-CM | POA: Diagnosis not present

## 2018-03-23 DIAGNOSIS — Z7189 Other specified counseling: Secondary | ICD-10-CM | POA: Diagnosis not present

## 2018-03-23 DIAGNOSIS — E43 Unspecified severe protein-calorie malnutrition: Secondary | ICD-10-CM | POA: Diagnosis present

## 2018-03-23 DIAGNOSIS — I4891 Unspecified atrial fibrillation: Secondary | ICD-10-CM | POA: Diagnosis present

## 2018-03-23 DIAGNOSIS — Z7951 Long term (current) use of inhaled steroids: Secondary | ICD-10-CM | POA: Diagnosis not present

## 2018-03-23 DIAGNOSIS — Z79899 Other long term (current) drug therapy: Secondary | ICD-10-CM | POA: Diagnosis not present

## 2018-03-23 DIAGNOSIS — M4856XA Collapsed vertebra, not elsewhere classified, lumbar region, initial encounter for fracture: Secondary | ICD-10-CM | POA: Diagnosis present

## 2018-03-23 DIAGNOSIS — I69320 Aphasia following cerebral infarction: Secondary | ICD-10-CM | POA: Diagnosis not present

## 2018-03-23 LAB — CBC
HEMATOCRIT: 39.6 % (ref 36.0–46.0)
Hemoglobin: 12.8 g/dL (ref 12.0–15.0)
MCH: 30.4 pg (ref 26.0–34.0)
MCHC: 32.3 g/dL (ref 30.0–36.0)
MCV: 94.1 fL (ref 78.0–100.0)
Platelets: 283 10*3/uL (ref 150–400)
RBC: 4.21 MIL/uL (ref 3.87–5.11)
RDW: 13.3 % (ref 11.5–15.5)
WBC: 11.9 10*3/uL — ABNORMAL HIGH (ref 4.0–10.5)

## 2018-03-23 LAB — BASIC METABOLIC PANEL
Anion gap: 15 (ref 5–15)
BUN: 34 mg/dL — AB (ref 6–20)
CHLORIDE: 102 mmol/L (ref 101–111)
CO2: 24 mmol/L (ref 22–32)
CREATININE: 1.22 mg/dL — AB (ref 0.44–1.00)
Calcium: 9.4 mg/dL (ref 8.9–10.3)
GFR calc Af Amer: 43 mL/min — ABNORMAL LOW (ref >60)
GFR calc non Af Amer: 37 mL/min — ABNORMAL LOW (ref >60)
GLUCOSE: 151 mg/dL — AB (ref 65–99)
POTASSIUM: 4.2 mmol/L (ref 3.5–5.1)
Sodium: 141 mmol/L (ref 135–145)

## 2018-03-23 LAB — GLUCOSE, CAPILLARY
GLUCOSE-CAPILLARY: 136 mg/dL — AB (ref 65–99)
Glucose-Capillary: 136 mg/dL — ABNORMAL HIGH (ref 65–99)

## 2018-03-23 MED ORDER — METOPROLOL TARTRATE 5 MG/5ML IV SOLN
10.0000 mg | Freq: Four times a day (QID) | INTRAVENOUS | Status: DC | PRN
  Administered 2018-03-24: 10 mg via INTRAVENOUS
  Filled 2018-03-23 (×2): qty 10

## 2018-03-23 MED ORDER — JEVITY 1.2 CAL PO LIQD
1000.0000 mL | ORAL | Status: DC
  Administered 2018-03-23: 1000 mL
  Filled 2018-03-23 (×3): qty 1000

## 2018-03-23 MED ORDER — METOPROLOL TARTRATE 5 MG/5ML IV SOLN
5.0000 mg | Freq: Three times a day (TID) | INTRAVENOUS | Status: DC
  Administered 2018-03-23 – 2018-03-25 (×6): 5 mg via INTRAVENOUS
  Filled 2018-03-23 (×5): qty 5

## 2018-03-23 NOTE — ED Notes (Signed)
Attempted report 

## 2018-03-23 NOTE — Evaluation (Signed)
Physical Therapy Evaluation Patient Details Name: Erin Cox MRN: [id number] DOB: October 30, 1920 Today's Date: 03/23/2018   History of Present Illness  Erin Cox is a delightful 84 y.o. female with medical history significant for atrial fibrillation, COPD not on home oxygen, prior stroke, hypertension,chronic kidney disease,severe protein calorie malnutrition,presents to the emergency department chief complaint generalized weakness and difficulty swallowing. Initial evaluation reveals uncontrolled blood pressure and includes evaluation of speech therapy who recommends nothing by mouth admission for further swallow evaluation.  Clinical Impression  Pt admitted with above diagnosis. Pt currently with functional limitations due to the deficits listed below (see PT Problem List). Pt was able to ambulate to bathroom and back to bed.  Fairly steady with RW needing cues.  Granddaughter states pt will have 24 hour care.  Will follow acutely.  Pt will benefit from skilled PT to increase their independence and safety with mobility to allow discharge to the venue listed below.      Follow Up Recommendations Home health PT;Supervision/Assistance - 24 hour([facility] program PTA)    Equipment Recommendations  None recommended by PT    Recommendations for Other Services       Precautions / Restrictions Precautions Precautions: Fall Restrictions Weight Bearing Restrictions: No      Mobility  Bed Mobility Overal bed mobility: Independent                Transfers Overall transfer level: Needs assistance Equipment used: Rolling walker (2 wheeled) Transfers: Sit to/from Stand Sit to Stand: Min guard         General transfer comment: A little assist to steady initially upon standing  Ambulation/Gait Ambulation/Gait assistance: Min assist Ambulation Distance (Feet): 45 Feet Assistive device: Rolling walker (2 wheeled) Gait Pattern/deviations: Decreased stride length;Step-through  pattern;Drifts right/left;Trunk flexed;Leaning posteriorly   Gait velocity interpretation: <1.31 ft/sec, indicative of household ambulator General Gait Details: Pt with significant trunk flexion.  Posterior lean needing min assist at times. Pt needs assist to steer RW as she steers it into objects and walls.  Pt walking better today per granddaughter in room as she states pt could not even take a step when they brought her into hospital .   Stairs            Wheelchair Mobility    Modified Rankin (Stroke Patients Only)       Balance Overall balance assessment: Needs assistance Sitting-balance support: No upper extremity supported;Feet supported Sitting balance-Leahy Scale: Fair     Standing balance support: Bilateral upper extremity supported;During functional activity Standing balance-Leahy Scale: Poor Standing balance comment: relies on UEs for balance                             Pertinent Vitals/Pain Pain Assessment: Faces Faces Pain Scale: Hurts little more Pain Location: back Pain Descriptors / Indicators: Aching;Grimacing;Guarding;Sore Pain Intervention(s): Limited activity within patient's tolerance;Monitored during session;Repositioned  VSS  Home Living Family/patient expects to be discharged to:: Private residence Living Arrangements: Alone;Other relatives Available Help at Discharge: Family;Available 24 hours/day;Personal care attendant(granddaughter states pt will have 24 hour care) Type of Home: House Home Access: Stairs to enter   Entrance Stairs-Number of Steps: 2 Home Layout: One level Home Equipment: Adaptive equipment;Walker - 4 wheels;Bedside commode;Shower seat      Prior Function Level of Independence: Independent with assistive device(s);Needs assistance   Gait / Transfers Assistance Needed: has been using rollator since recent CVA  ADL's / Homemaking Assistance  Needed: Has Home first assisting her   Comments: Pt just d/c'd from  SNF and has been getting Home first with [facility] at home.       Hand Dominance   Dominant Hand: Right    Extremity/Trunk Assessment   Upper Extremity Assessment Upper Extremity Assessment: Defer to OT evaluation    Lower Extremity Assessment Lower Extremity Assessment: Generalized weakness    Cervical / Trunk Assessment Cervical / Trunk Assessment: Kyphotic  Communication   Communication: No difficulties  Cognition Arousal/Alertness: Awake/alert Behavior During Therapy: Flat affect Overall Cognitive Status: Impaired/Different from baseline Area of Impairment: Following commands;Safety/judgement;Problem solving                       Following Commands: Follows one step commands inconsistently;Follows one step commands with increased time Safety/Judgement: Decreased awareness of safety;Decreased awareness of deficits   Problem Solving: Difficulty sequencing;Requires verbal cues;Requires tactile cues        General Comments      Exercises     Assessment/Plan    PT Assessment Patient needs continued PT services  PT Problem List Decreased activity tolerance;Decreased balance;Decreased mobility;Decreased knowledge of use of DME;Decreased safety awareness;Decreased knowledge of precautions;Pain       PT Treatment Interventions DME instruction;Gait training;Stair training;Functional mobility training;Therapeutic activities;Therapeutic exercise;Balance training;Patient/family education    PT Goals (Current goals can be found in the Care Plan section)  Acute Rehab PT Goals Patient Stated Goal: to go home PT Goal Formulation: With patient Time For Goal Achievement: 04/06/18 Potential to Achieve Goals: Good    Frequency Min 3X/week   Barriers to discharge        Co-evaluation               AM-PAC PT "6 Clicks" Daily Activity  Outcome Measure Difficulty turning over in bed (including adjusting bedclothes, sheets and blankets)?: None Difficulty  moving from lying on back to sitting on the side of the bed? : None Difficulty sitting down on and standing up from a chair with arms (e.g., wheelchair, bedside commode, etc,.)?: A Little Help needed moving to and from a bed to chair (including a wheelchair)?: A Little Help needed walking in hospital room?: A Lot Help needed climbing 3-5 steps with a railing? : Total 6 Click Score: 17    End of Session Equipment Utilized During Treatment: Gait belt Activity Tolerance: Patient limited by fatigue Patient left: in bed;with call bell/phone within reach;with bed alarm set;with family/visitor present Nurse Communication: Mobility status(left UE bleeding, bruised and swollen - nurse paged MD) PT Visit Diagnosis: Unsteadiness on feet (R26.81);Muscle weakness (generalized) (M62.81);Pain Pain - part of body: (back)    Time: 7322-0254 PT Time Calculation (min) (ACUTE ONLY): 29 min   Charges:   PT Evaluation $PT Eval Moderate Complexity: 1 Mod PT Treatments $Gait Training: 8-22 mins   PT G Codes:        Tamelia Michalowski,PT Acute Rehabilitation [phone] (pager)   Denice Paradise 03/23/2018, 10:27 AM

## 2018-03-23 NOTE — Progress Notes (Addendum)
Patient admitted to 6E23 from [facility] via wheelchair.  Bed in low position, wheels locked.  Patient denies chest pain/shortness of breath.  Telemetry monitor applied.  Patient oriented to environment, including call bell, TV, meal times, and hourly rounding.  Granddaughter-in-law at bedside.  Left arm purple and swollen from hand to an inch below the [facility].  Arm elevated on two pillows and wrapped from fingers to just below the [facility].

## 2018-03-23 NOTE — Progress Notes (Signed)
  Speech Language Pathology Treatment: Dysphagia  Patient Details Name: Erin Cox MRN: [id number] DOB: [birth year] Today's Date: 03/23/2018 Time: 9935-7017 SLP Time Calculation (min) (ACUTE ONLY): 10 min  Assessment / Plan / Recommendation Clinical Impression  Pt was scheduled for MBS in radiology. Rad tech indicated pt was having Cortrak placed, so SLP went to bedside to assess appropriateness for MBS. Dietician and pt's daughter were with pt. SLP explained process of MBS and discussed the difficulty pt has been having. Feeding tube was placed due to urgent need for immediate nutrition/hydration. SLP and daughter decided that it would be in pt's best interest to hold MBS at this time, to allow time for nutrition/hydration to be started, and hopefully provide pt with some strength so she is better equipped to successfully proceed with objective study. SLP will continue to follow pt at bedside to assess readiness for po trials/instrumental study. RN and MD were informed of change in plans.     HPI HPI: 84 year old female with history of prior stroke, asthma, COPD, hypertension. Presented to ED from home via transportation from neighbor for evaluation of choking sensation. Daughters present and report pt choked on breakfast this morning, vomited multiple times of white foamy spit and inability to stand after choking. She also complaining of pain to her lower back across her back. She does endorse some shortness of breath which is not new. She does have history of high bloodpressure and when asked if she took her medication this morning, patient states "if I took it I have vomited up". She denies any active chest pain, no fever chills. Pt with recent admission for 01/2018 with MRI (4/18) revealed a moderate-sized acute nonhemorrhagic pontine infarct. Wide patency of the adjacent basilar artery suggests small-vessel insult, advanced atrophy, and chronic appearing occlusion left ICA. Per daughters'  report, pt discharged to [facility] on 02/14/18 had aspiration event that led to aspiration PNA on 02/16/18 with diet downgraded at bedside to puree with nectar thick liquids. Per report, pt continues with coughing on purees and nectar with facility planned instrumental swallow study but pt discharged home before facility could schedule. She discharged home on 5/16 with 24 hour care givers and continued overt s/s of aspiration with puree and nectar thick liquids (coughing per daughters' report).       SLP Plan  Continue with current plan of care       Recommendations  Diet recommendations: NPO Medication Administration: Via alternative means   Oral care             Oral Care Recommendations: Oral care QID Follow up Recommendations: Skilled Nursing facility SLP Visit Diagnosis: Dysphagia, oropharyngeal phase (R13.12) Plan: Continue with current plan of care       GO               Reyan Helle B. Quentin Ore [facility], CCC-SLP Speech Language Pathologist [phone]  Shonna Chock 03/23/2018, 11:50 AM

## 2018-03-23 NOTE — Progress Notes (Signed)
Initial Nutrition Assessment  DOCUMENTATION CODES:   Underweight, Severe malnutrition in context of chronic illness  INTERVENTION:   Jevity 1.2 @ 30 ml/hr Increase by 10 ml every 8 hours to goal rate of 50 ml/hr (1200 ml).   Provides: 1440 kcals, 67 grams protein, 972 ml free water. Meets 100% of needs.   Recommend checking magnesium and phosphorus as pt is at high risk for refeeding.   NUTRITION DIAGNOSIS:   Severe Malnutrition related to chronic illness(CVA/dysphagia) as evidenced by percent weight loss, energy intake < or equal to 75% for > or equal to 1 month, severe fat depletion, severe muscle depletion.  GOAL:   Patient will meet greater than or equal to 90% of their needs  MONITOR:   Diet advancement, Weight trends, TF tolerance, I & O's, Labs  REASON FOR ASSESSMENT:   Consult Enteral/tube feeding initiation and management  ASSESSMENT:   Patient with PMH significant for COPD, HTN, and stroke. Recently admitted 4/19 for stroke. Presents this admission with dysphagia from unknown etiology and generalized weakness.    Spoke with daughter at bedside. Reports since pt's stoke one month ago she has remained on a pureed diet. At home pt received three meals that contained a meat, vegetable, and grain. Per daughter, pt would usually eat 25-50% of each meal and consume three magic cups a day. This week, pt showed to have difficulty swallowing with pureed foods/liquids and intake declined further to bites/sips. Pt is currently NPO and had a cortrak placed post pyloric today. RD to order TF.   Daughter reports pt's UBW s/p stroke stayed around 83lb and an unknown amount of recent wt loss. Records indicate pt weighed 85 lb on 02/10/18 and 69 lb this admission (18.8% wt loss in one month, significant for time frame). Nutrition-Focused physical exam completed.   Medications reviewed.  Labs reviewed.   NUTRITION - FOCUSED PHYSICAL EXAM:    Most Recent Value  Orbital Region   Moderate depletion  Upper Arm Region  Severe depletion  Thoracic and Lumbar Region  Severe depletion  Buccal Region  Moderate depletion  Temple Region  Severe depletion  Clavicle Bone Region  Severe depletion  Clavicle and Acromion Bone Region  Severe depletion  Scapular Bone Region  Severe depletion  Dorsal Hand  Moderate depletion  Patellar Region  Severe depletion  Anterior Thigh Region  Severe depletion  Posterior Calf Region  Severe depletion  Edema (RD Assessment)  None  Hair  Reviewed  Eyes  Reviewed  Mouth  Reviewed  Skin  Reviewed  Nails  Reviewed     Diet Order:   Diet Order           Diet NPO time specified  Diet effective now          EDUCATION NEEDS:   Education needs have been addressed  Skin:  Skin Assessment: Reviewed RN Assessment  Last BM:  PTA  Height:   Ht Readings from Last 1 Encounters:  03/23/18 5' (1.524 m)    Weight:   Wt Readings from Last 1 Encounters:  03/23/18 68 lb 5.5 oz (31 kg)    Ideal Body Weight:  45.5 kg  BMI:  Body mass index is 13.35 kg/m.  Estimated Nutritional Needs:   Kcal:  1250-1450 kcal   Protein:  65-75 g  Fluid:  >1.2 L/day    Mariana Single RD, LDN Clinical Nutrition Pager # [phone]

## 2018-03-23 NOTE — ED Notes (Signed)
Admitting paged about left arm swelling and bleeding

## 2018-03-23 NOTE — Progress Notes (Signed)
PROGRESS NOTE    Erin Cox  IHK:[id number] DOB: Mar 19, 1921 DOA: 03/22/2018 PCP: [name], MD  Brief Narrative:84 y.o. female with medical history significant for atrial fibrillation, COPD not on home oxygen, prior stroke, hypertension,chronic kidney disease,severe protein calorie malnutrition,presents to the emergency department chief complaint generalized weakness and difficulty swallowing. Initial evaluation reveals uncontrolled blood pressure and includes evaluation of speech therapy who recommends nothing by mouth admission for further swallow evaluation.  Information is obtained from the granddaughters and the patient. Patient was recently discharged from his left home where she has 24 7 care. Patient reports this morning she was eating oatmeal and she became choked.She coughed and "gagged" and coughed up white foamy secretions. Also began to complain of pain in her low back. She denies chest pain palpitation shortness of breath. She denies nauseaabdominal pain headache dizziness syncope or near-syncope. He does note that she has had difficulties with swallowing and her granddaughters confirm since her recent hospitalization.family reports during that hospitalization she was evaluated by speech therapy and they recommended a thickening agent for liquids. Knee also reports this morning patient had difficulty bearing weight and complained of low back pain. Her baseline is independent with walker is able to get up and go off of chair couch toilet. Able to ambulate with walker. No recent falls. She denies dysuria hematuria frequency or urgency. She does endorse a decreased oral intake because she has frequent episodes of "choking". She denies diarrhea constipation melena bright red blood per rectum ED Course: in the emergency department she is hypertensive EKG reveals A. Fib with a rate 110 and not hypoxic she is afebrile nontoxic appearing. She is provided with metoprolol 2. He is evaluated at  the bedside by speech therapy as noted above.    Assessment & Plan:   Principal Problem:   Dysphagia Active Problems:   CKD (chronic kidney disease), stage III (HCC)   COPD (chronic obstructive pulmonary disease) (HCC)   Anxiety   Essential hypertension   Compression fracture of lumbar spine, non-traumatic (HCC)   Back pain   Atrial fibrillation ([place])  #1. Dysphagia-patient had a stroke last month and was sent out to a skilled nursing facility.  At the facility she continued to have dysphagia and choking and vomiting.  Her foot was downgraded to pured diet with nectar thick liquids even with that patient continued to have choking episodes.  She was discharged from the facility 03/10/2018.  She had an episode of aspiration pneumonia while at the facility.  She was seen by speech therapy and MBS has been ordered.  Will attempt to place a core tract tube for feeding temporarily.  Patient refuses to have a PEG tube placed at this time.  Continue slow IV hydration.  CT scan of the head shows no acute stroke but it does show atrophy and old stroke right pontine area.  #2.Multiple compression fracture of the lumbar spine. patient unable to bear weight as of this morning. No recent trauma.CT scan of the lumbar spine show chronic compression fractures T12 L2-L3 L5 and advanced osteopenia.  Moderate spinal stenosis L3-4 and mild spinal stenosis L2-3 and L4-5.  No acute fracture was noted.    S#3. Atrial fibrillation. [place] score 5. EKG as noted above. Home medications includePlavix Toprol  -Holding Plavix for now secondary to above -IV metoprolol with parameters -Monitor  #4. Hypertension. Poor control in the emergency department. Patient has been unable to take her antihypertensive medications. They include clonidine,plendil, etoprolol, losartan -We'll convert clonidine  to a patch -iV metoprolol as noted above -When necessary hydralazine  #5. COPD. Not on home oxygen. Stable at baseline.  Oxygen saturation level greater than 90% on room air. Chest x-ray without a cardiopulmonary process. Home medications include Symbicort and theophylline. -Continue Symbicort -Monitor  #6. Chronic kidney disease. Stage III. Creatinine 1.3 on admission.  Improved to 1.2 with slow IV hydration. -Hold nephrotoxins -gentle IV fluids        DVT prophylaxis:scd Code Status dnr Family Communication granddaughter Disposition Plan:bd Consultants: none  Procedures:none  Antimicrobials none Subjective: Patient complains of difficulty swallowing more solid than liquid.  Has had this problem for the last 2 weeks so had decreased intake at home.  Granddaughter very concerned about the swelling and bleeding in the left upper extremity from IV line that was placed in the emergency room per patient's family.  IV infiltrated and she has left upper extremity swelling.  Objective: Vitals:   03/23/18 0635 03/23/18 0821 03/23/18 0916 03/23/18 1021  BP: 130/81 (!) 154/84  (!) 140/95  Pulse: 97 91 (!) 106 (!) 105  Resp: 16 16 20 19   Temp:      TempSrc:    Oral  SpO2: 95% 94% 97% 93%  Weight:    31 kg (68 lb 5.5 oz)  Height:    5' (1.524 m)    Intake/Output Summary (Last 24 hours) at 03/23/2018 1035 Last data filed at 03/22/2018 1545 Gross per 24 hour  Intake 1000.4 ml  Output -  Net 1000.4 ml   Filed Weights   03/22/18 1056 03/23/18 1021  Weight: 37.6 kg (83 lb) 31 kg (68 lb 5.5 oz)    Examination:  General exam: Appears calm and comfortable  Respiratory system: Clear to auscultation. Respiratory effort normal. Cardiovascular system: S1 & S2 heard, RRR. No JVD, murmurs, rubs, gallops or clicks. No pedal edema. Gastrointestinal system: Abdomen is nondistended, soft and nontender. No organomegaly or masses felt. Normal bowel sounds heard. Central nervous system: Alert and oriented. No focal neurological deficits. Extremities LUE edema and bruising Skin: No rashes, lesions or  ulcers Psychiatry: Judgement and insight appear normal. Mood & affect appropriate.     Data Reviewed: I have personally reviewed following labs and imaging studies  CBC: Recent Labs  Lab 03/22/18 1119 03/23/18 0417  WBC 8.0 11.9*  NEUTROABS 5.6  --   HGB 13.6 12.8  HCT 43.7 39.6  MCV 95.0 94.1  PLT 217 595   Basic Metabolic Panel: Recent Labs  Lab 03/22/18 1119 03/23/18 0417  NA 143 141  K 4.7 4.2  CL 102 102  CO2 30 24  GLUCOSE 108* 151*  BUN 34* 34*  CREATININE 1.34* 1.22*  CALCIUM 10.0 9.4   GFR: Estimated Creatinine Clearance: 13.8 mL/min (A) (by C-G formula based on SCr of 1.22 mg/dL (H)). Liver Function Tests: Recent Labs  Lab 03/22/18 1119  AST 22  ALT 15  ALKPHOS 94  BILITOT 0.7  PROT 7.1  ALBUMIN 3.8   Recent Labs  Lab 03/22/18 1119  LIPASE 41   No results for input(s): AMMONIA in the last 168 hours. Coagulation Profile: No results for input(s): INR, PROTIME in the last 168 hours. Cardiac Enzymes: No results for input(s): CKTOTAL, CKMB, CKMBINDEX, TROPONINI in the last 168 hours. BNP (last 3 results) No results for input(s): PROBNP in the last 8760 hours. HbA1C: No results for input(s): HGBA1C in the last 72 hours. CBG: No results for input(s): GLUCAP in the last 168 hours. Lipid Profile:  No results for input(s): CHOL, HDL, LDLCALC, TRIG, CHOLHDL, LDLDIRECT in the last 72 hours. Thyroid Function Tests: No results for input(s): TSH, T4TOTAL, FREET4, T3FREE, THYROIDAB in the last 72 hours. Anemia Panel: No results for input(s): VITAMINB12, FOLATE, FERRITIN, TIBC, IRON, RETICCTPCT in the last 72 hours. Sepsis Labs: No results for input(s): PROCALCITON, LATICACIDVEN in the last 168 hours.  No results found for this or any previous visit (from the past 240 hour(s)).       Radiology Studies: Dg Lumbar Spine Complete  Result Date: 03/22/2018 CLINICAL DATA:  Fall.  Back pain. EXAM: LUMBAR SPINE - COMPLETE 4+ VIEW COMPARISON:  CT  02/14/2012.  Abdomen series 02/14/2012. FINDINGS: Diffuse osteopenia. Multilevel degenerative change lumbar spine and both hips. Multiple mild to moderate thoraco lumbar spine compression fractures are again noted. Similar findings noted on prior exams. If symptoms persist and a superimposed acute injury is suspected MRI can be obtained. IMPRESSION: 1. Multiple mild-to-moderate thoracolumbar spine compression fractures again noted. Similar findings noted on prior exam. If symptoms persist and a superimposed acute injury remains suspected MRI can be obtained. 2.  Diffuse osteopenia degenerative change again noted. Electronically Signed   By: Marcello Moores  Register   On: 03/22/2018 13:47   Dg Elbow Complete Left  Result Date: 03/22/2018 CLINICAL DATA:  [place] with bruising of the left elbow EXAM: LEFT ELBOW - COMPLETE 3+ VIEW COMPARISON:  None. FINDINGS: A true lateral view of the left elbow was not obtained but no definite joint effusion is seen on the images obtained. The bones appear somewhat osteopenic. Alignment is normal. Joint spaces appear normal. No fracture is noted. IMPRESSION: No definite fracture or effusion.  Osteopenia. Electronically Signed   By: Ivar Drape M.D.   On: 03/22/2018 13:46   Ct Head Wo Contrast  Result Date: 03/22/2018 CLINICAL DATA:  Generalized weakness EXAM: CT HEAD WITHOUT CONTRAST TECHNIQUE: Contiguous axial images were obtained from the base of the skull through the vertex without intravenous contrast. COMPARISON:  CT and MRI 02/10/2018 FINDINGS: Brain: Moderate atrophy. Negative for hydrocephalus. Chronic infarct right pons. Mild chronic ischemia in the white matter. Negative for acute infarct, hemorrhage, or mass Vascular: Atherosclerotic calcification. Negative for hyperdense vessel Skull: Negative Sinuses/Orbits: Negative Other: None IMPRESSION: Atrophy and chronic ischemia. Chronic infarct right pons. No acute abnormality. Electronically Signed   By: Franchot Gallo M.D.   On:  03/22/2018 19:19   Ct Lumbar Spine Wo Contrast  Result Date: 03/22/2018 CLINICAL DATA:  Rib fractures suspected post CPR.  Back pain EXAM: CT LUMBAR SPINE WITHOUT CONTRAST TECHNIQUE: Multidetector CT imaging of the lumbar spine was performed without intravenous contrast administration. Multiplanar CT image reconstructions were also generated. COMPARISON:  CT abdomen pelvis 02/14/2012. Lumbar spine radiographs 03/22/2018 FINDINGS: Segmentation: Normal Alignment: Normal Vertebrae: Diffuse osteopenia, advanced. Moderate compression fracture T12 unchanged. Mild compression fractures L2 and L3 unchanged. Moderate compression fracture L5 unchanged. No acute fracture or mass. Paraspinal and other soft tissues: Atherosclerotic calcification. Abdominal aorta measuring up to 25 mm in diameter. Disc levels: Mild spinal stenosis L2-3. Moderate spinal stenosis L3-4. Mild spinal stenosis L4-5. IMPRESSION: Chronic compression fractures T12, L2, L3, L5.  No acute fracture. Advanced osteopenia. Atherosclerotic aorta measuring up to 25 mm in diameter Multilevel lumbar degenerative change. Mild spinal stenosis L2-3 and L4-5. Moderate spinal stenosis L3-4. Electronically Signed   By: Franchot Gallo M.D.   On: 03/22/2018 19:15   Dg Chest Portable 1 View  Result Date: 03/22/2018 CLINICAL DATA:  Choking sensation.  Difficulty swallowing.  EXAM: PORTABLE CHEST 1 VIEW COMPARISON:  Two-view chest x-ray 02/10/2018.  CTA chest 02/02/2013. FINDINGS: The heart is enlarged. Atherosclerotic changes are present at the aorta. Emphysematous changes are present in the lungs bilaterally. There is scarring at both lung apices. No edema or effusion is present. There is no evidence for aspiration or focal airspace disease. IMPRESSION: 1. No acute cardiopulmonary disease. 2.  Aortic Atherosclerosis (ICD10-I70.0). 3.  Emphysema (ICD10-J43.9). Electronically Signed   By: San Morelle M.D.   On: 03/22/2018 11:05        Scheduled Meds: .  cloNIDine  0.2 mg Transdermal Weekly  . enoxaparin (LOVENOX) injection  30 mg Subcutaneous Q24H  . metoprolol tartrate  5 mg Intravenous Q8H  . mometasone-formoterol  2 puff Inhalation BID  . rosuvastatin  5 mg Oral QPM   Continuous Infusions: . sodium chloride 50 mL/hr at 03/23/18 7943     LOS: 1 day     Georgette Shell, MD [facility]  If 7PM-7AM, please contact night-coverage [email or web address] Password [facility] 03/23/2018, 10:35 AM

## 2018-03-23 NOTE — ED Notes (Signed)
Pt alert and resting in bed Call light within reach

## 2018-03-23 NOTE — Progress Notes (Signed)
Cortrak Tube Team Note:  Consult received to place a Cortrak feeding tube.   A 10 F Cortrak tube was placed in the left nare and secured with tape at 83 cm. Per the Cortrak monitor reading the tube tip is post pyloric.   Attempted to place bridle multiple times but could not connect stylet. Pt became anxious and asked to stop. Discussed the possibility of the tube becoming dislodged. Pt would like to continue with secure tape.   No x-ray is required. RN may begin using tube.   If the tube becomes dislodged please keep the tube and contact the Cortrak team at [email or web address] (password [email or web address]) for replacement.  If after hours and replacement cannot be delayed, place a NG tube and confirm placement with an abdominal x-ray.    Mariana Single RD, LDN Clinical Nutrition Pager # [phone]

## 2018-03-23 NOTE — Progress Notes (Signed)
Room on 6E changed to 6E23 (closer to the nurses station).  Will call to receive report when room is clean.

## 2018-03-23 NOTE — Progress Notes (Signed)
Tube feeding initiated at 30cc/hr.  Order to increase by 10cc/hr every 8 hours with a goal of 50cc/hr (will increase to 40cc/hr at 2030 and 50cc/hr at [facility] 5/30).

## 2018-03-23 NOTE — ED Notes (Signed)
Admitting aware of arm swelling and bruising. Admitting at bedside

## 2018-03-23 NOTE — ED Notes (Addendum)
This RN and Colletta Maryland NT went in pt's room upon hearing dinamap alarm and found pt sitting up in bed, pulling EKG cords off, stating, "I'm getting up." Pt instructed she cannot get up and was repositioned for comfort with monitor reapplied. Pt cooperative and agreeable and is resting comfortably.

## 2018-03-23 NOTE — Progress Notes (Signed)
Patient's granddaughter-in-law very concerned about patient's left arm (swollen, purple) and right arm (abrasion).  2+ radial pulse.  Arm has been elevated on 2 pillows since arrival to [place].  Ice pack in place.  Patient's other granddaughter-in-law states that the patient's right forearm abrasion from previous admission had yellow/green drainage yesterday.  However, no evidence of any drainage at this time.  Paged Dr. Zigmund Daniel, who spoke on the phone with the granddaughter.

## 2018-03-24 ENCOUNTER — Inpatient Hospital Stay (HOSPITAL_COMMUNITY): Payer: Medicare Other

## 2018-03-24 LAB — CBC
HCT: 35.7 % — ABNORMAL LOW (ref 36.0–46.0)
Hemoglobin: 11.4 g/dL — ABNORMAL LOW (ref 12.0–15.0)
MCH: 30.1 pg (ref 26.0–34.0)
MCHC: 31.9 g/dL (ref 30.0–36.0)
MCV: 94.2 fL (ref 78.0–100.0)
PLATELETS: 223 10*3/uL (ref 150–400)
RBC: 3.79 MIL/uL — AB (ref 3.87–5.11)
RDW: 13.7 % (ref 11.5–15.5)
WBC: 8.6 10*3/uL (ref 4.0–10.5)

## 2018-03-24 LAB — BASIC METABOLIC PANEL
Anion gap: 9 (ref 5–15)
BUN: 40 mg/dL — ABNORMAL HIGH (ref 6–20)
CALCIUM: 8.9 mg/dL (ref 8.9–10.3)
CO2: 25 mmol/L (ref 22–32)
CREATININE: 1.18 mg/dL — AB (ref 0.44–1.00)
Chloride: 111 mmol/L (ref 101–111)
GFR calc non Af Amer: 38 mL/min — ABNORMAL LOW (ref >60)
GFR, EST AFRICAN AMERICAN: 44 mL/min — AB (ref >60)
Glucose, Bld: 140 mg/dL — ABNORMAL HIGH (ref 65–99)
Potassium: 3.5 mmol/L (ref 3.5–5.1)
SODIUM: 145 mmol/L (ref 135–145)

## 2018-03-24 LAB — GLUCOSE, CAPILLARY
GLUCOSE-CAPILLARY: 144 mg/dL — AB (ref 65–99)
GLUCOSE-CAPILLARY: 162 mg/dL — AB (ref 65–99)
GLUCOSE-CAPILLARY: 143 mg/dL — AB (ref 65–99)
GLUCOSE-CAPILLARY: 173 mg/dL — AB (ref 65–99)
Glucose-Capillary: 133 mg/dL — ABNORMAL HIGH (ref 65–99)

## 2018-03-24 LAB — TSH: TSH: 2.473 u[IU]/mL (ref 0.350–4.500)

## 2018-03-24 LAB — PREALBUMIN: PREALBUMIN: 19.8 mg/dL (ref 18–38)

## 2018-03-24 MED ORDER — RESOURCE THICKENUP CLEAR PO POWD
ORAL | Status: DC | PRN
  Filled 2018-03-24: qty 125

## 2018-03-24 MED ORDER — DILTIAZEM HCL 60 MG PO TABS
30.0000 mg | ORAL_TABLET | Freq: Four times a day (QID) | ORAL | Status: DC
  Administered 2018-03-24 – 2018-03-25 (×6): 30 mg via ORAL
  Administered 2018-03-26: 20 mg via ORAL
  Administered 2018-03-26 – 2018-03-28 (×11): 30 mg via ORAL
  Filled 2018-03-24 (×17): qty 1

## 2018-03-24 NOTE — Progress Notes (Signed)
Physical Therapy Treatment Patient Details Name: Erin Cox MRN: [id number] DOB: [birth year] Today's Date: 03/24/2018    History of Present Illness Erin Cox is a delightful 84 y.o. female with medical history significant for atrial fibrillation, COPD not on home oxygen, prior stroke, hypertension,chronic kidney disease,severe protein calorie malnutrition,presents to the emergency department chief complaint generalized weakness and difficulty swallowing. Initial evaluation reveals uncontrolled blood pressure and includes evaluation of speech therapy who recommends nothing by mouth admission for further swallow evaluation.    PT Comments    Pt admitted with above diagnosis. Pt currently with functional limitations due to balance and endurance deficits. Pt was able to ambulate in hallway for a short distance limited by incr HR to 137 bpm.  Notified nursing. Needs cues for steering RW in enclosed spaces.   Pt will benefit from skilled PT to increase their independence and safety with mobility to allow discharge to the venue listed below.     Follow Up Recommendations  Home health PT;Supervision/Assistance - 24 hour([facility] First program PTA)     Equipment Recommendations  None recommended by PT    Recommendations for Other Services       Precautions / Restrictions Precautions Precautions: Fall Restrictions Weight Bearing Restrictions: No    Mobility  Bed Mobility Overal bed mobility: Independent                Transfers Overall transfer level: Needs assistance Equipment used: Rolling walker (2 wheeled) Transfers: Sit to/from Stand Sit to Stand: Min guard         General transfer comment: A little assist to steady initially upon standing  Ambulation/Gait Ambulation/Gait assistance: Min assist Ambulation Distance (Feet): 110 Feet Assistive device: Rolling walker (2 wheeled) Gait Pattern/deviations: Decreased stride length;Step-through pattern;Drifts  right/left;Trunk flexed;Leaning posteriorly   Gait velocity interpretation: <1.31 ft/sec, indicative of household ambulator General Gait Details: Pt with slight trunk flexion.  Posterior lean needing min assist at times. Pt needs assist to steer RW as she steers it into objects on left in close spaces and walls.     Stairs             Wheelchair Mobility    Modified Rankin (Stroke Patients Only)       Balance Overall balance assessment: Needs assistance Sitting-balance support: No upper extremity supported;Feet supported Sitting balance-Leahy Scale: Fair     Standing balance support: Bilateral upper extremity supported;During functional activity Standing balance-Leahy Scale: Poor Standing balance comment: relies on UEs for balance                            Cognition Arousal/Alertness: Awake/alert Behavior During Therapy: Flat affect Overall Cognitive Status: Impaired/Different from baseline Area of Impairment: Following commands;Safety/judgement;Problem solving                       Following Commands: Follows one step commands inconsistently;Follows one step commands with increased time Safety/Judgement: Decreased awareness of safety;Decreased awareness of deficits   Problem Solving: Difficulty sequencing;Requires verbal cues;Requires tactile cues        Exercises      General Comments General comments (skin integrity, edema, etc.): Pt HR up to 137 bpm.  Notified nursing who brought pt meds.       Pertinent Vitals/Pain Pain Assessment: Faces Faces Pain Scale: Hurts little more Pain Location: having cortrak placed Pain Descriptors / Indicators: Discomfort;Grimacing;Guarding Pain Intervention(s): Limited activity within patient's tolerance;Monitored during session;Repositioned  Home Living                      Prior Function            PT Goals (current goals can now be found in the care plan section) Acute Rehab PT  Goals Patient Stated Goal: to go home Progress towards PT goals: Progressing toward goals    Frequency    Min 3X/week      PT Plan Current plan remains appropriate    Co-evaluation              AM-PAC PT "6 Clicks" Daily Activity  Outcome Measure  Difficulty turning over in bed (including adjusting bedclothes, sheets and blankets)?: None Difficulty moving from lying on back to sitting on the side of the bed? : None Difficulty sitting down on and standing up from a chair with arms (e.g., wheelchair, bedside commode, etc,.)?: A Little Help needed moving to and from a bed to chair (including a wheelchair)?: A Little Help needed walking in hospital room?: A Little Help needed climbing 3-5 steps with a railing? : Total 6 Click Score: 18    End of Session Equipment Utilized During Treatment: Gait belt Activity Tolerance: Patient limited by fatigue Patient left: with call bell/phone within reach;with family/visitor present;in chair Nurse Communication: Mobility status PT Visit Diagnosis: Unsteadiness on feet (R26.81);Muscle weakness (generalized) (M62.81);Pain Pain - part of body: (back)     Time: 1443-1540 PT Time Calculation (min) (ACUTE ONLY): 17 min  Charges:  $Gait Training: 8-22 mins                    G Codes:       Erin Cox,PT Acute Rehabilitation [phone] (pager)    Denice Paradise 03/24/2018, 4:09 PM

## 2018-03-24 NOTE — Progress Notes (Signed)
Pt's heart rate sustaining in the 130's. BP 125/70. Paged on call provider. No interventions at this time. Will continue to monitor.

## 2018-03-24 NOTE — Progress Notes (Signed)
Modified Barium Swallow Progress Note  Patient Details  Name: Erin Cox MRN: [id number] Date of Birth: 1921-09-30  Today's Date: 03/24/2018  Modified Barium Swallow completed.  Full report located under Chart Review in the Imaging Section.  Brief recommendations include the following:  Clinical Impression  Moderately deceased oral control, cohesion, manipulation with delayed mastication and transit (with solid) with premature spill resulting in aspiration with nectar. Nectar thick fell into laryngeal vestibule before the swallow and subsequently was aspirated x 1 silently. Swallow was initiated timely with majority of swallows with one late initiation to pyriform sinuses with honey thick. Thin liquid transiently and briefly penetrated with one episode. An independent or cued second swallow cleared most of pharyngeal residue. Pt's granddaughter-in-laws present and reported she takes large consecutive sips even when cued and continues to cough with nectar. Recommend initiating honey thick liquids with therapy to increase oral control with small sips to mitigate premature loss with goal of returning to nectar thick and eventually thin if able.    Swallow Evaluation Recommendations       SLP Diet Recommendations: Dysphagia 2 (Fine chop) solids;Honey thick liquids   Liquid Administration via: Cup;No straw   Medication Administration: Crushed with puree   Supervision: Patient able to self feed;Full supervision/cueing for compensatory strategies   Compensations: Slow rate;Small sips/bites;Multiple dry swallows after each bite/sip   Postural Changes: Seated upright at 90 degrees   Oral Care Recommendations: Oral care BID        Houston Siren 03/24/2018,2:20 PM   Orbie Pyo [place].Ed [employer] [phone]

## 2018-03-24 NOTE — Progress Notes (Signed)
  Speech Language Pathology Patient Details Name: Ginelle Bays MRN: [id number] DOB: 04/20/1921 Today's Date: 03/24/2018 Time:  -     Chart reviewed and MBS is most appropriate intervention which is scheduled today at 1300.             GO                Houston Siren 03/24/2018, 11:10 AM Orbie Pyo [place].[employer] [phone]

## 2018-03-24 NOTE — Progress Notes (Signed)
Patient wants to complete a Healthcare POA this admission.  Chaplain notified and consult placed in [place].

## 2018-03-24 NOTE — Progress Notes (Signed)
Patient in a-fib with rate between 120 and 145.  Prn Metoprolol given.  Notified Dr. Zigmund Daniel.  New order received for Diltiazem 30mg  q 6hr via Cortrack tube.

## 2018-03-24 NOTE — Progress Notes (Signed)
PROGRESS NOTE    Erin Cox  XNA:[id number] DOB: 1920-11-15 DOA: 03/22/2018 PCP: [name], MD    Brief Narrative: 84 y.o.femalewith medical history significantfor atrial fibrillation, COPD not on home oxygen, prior stroke, hypertension,chronic kidney disease,severe protein calorie malnutrition,presents to the emergency department chief complaint generalized weakness and difficulty swallowing. Initial evaluation reveals uncontrolled blood pressure and includes evaluation of speech therapy who recommends nothing by mouth admission for further swallow evaluation.  Information is obtained from the granddaughters and the patient. Patient was recently discharged from his left home where she has 24 7 care. Patient reports this morning she was eating oatmeal and she became choked.She coughed and "gagged" and coughed up white foamy secretions. Also began to complain of pain in her low back. She denies chest pain palpitation shortness of breath. She denies nauseaabdominal pain headache dizziness syncope or near-syncope. He does note that she has had difficulties with swallowing and her granddaughters confirm since her recent hospitalization.family reports during that hospitalization she was evaluated by speech therapy and they recommended a thickening agent for liquids. Knee also reports this morning patient had difficulty bearing weight and complained of low back pain. Her baseline is independent with walker is able to get up and go off of chair couch toilet. Able to ambulate with walker. No recent falls. She denies dysuria hematuria frequency or urgency. She does endorse a decreased oral intake because she has frequent episodes of "choking". She denies diarrhea constipation melena bright red blood per rectum ED Course:in the emergency department she is hypertensive EKG reveals A. Fib with a rate 110 and not hypoxic she is afebrile nontoxic appearing. She is provided with metoprolol 2. He is evaluated  at the bedside by speech therapy as noted above.     Assessment & Plan:   Principal Problem:   Dysphagia Active Problems:   CKD (chronic kidney disease), stage III (HCC)   COPD (chronic obstructive pulmonary disease) (HCC)   Anxiety   Essential hypertension   Compression fracture of lumbar spine, non-traumatic (HCC)   Back pain   Atrial fibrillation ([place])  #1. Dysphagia-patient had a stroke last month and was sent out to a skilled nursing facility.  At the facility she continued to have dysphagia and choking and vomiting.  Her foot was downgraded to pured diet with nectar thick liquids even with that patient continued to have choking episodes.  She was discharged from the facility 03/10/2018.  She had an episode of aspiration pneumonia while at the facility.  She was seen by speech therapy and MBS has been ordered.  Will attempt to place a core tract tube for feeding temporarily.  Patient refuses to have a PEG tube placed at this time.  Continue slow IV hydration.  CT scan of the head shows no acute stroke but it does show atrophy and old stroke right pontine area.  #2.Multiple compression fracture of the lumbar spine. patient unable to bear weight as of this morning. No recent trauma.CT scan of the lumbar spine show chronic compression fractures T12 L2-L3 L5 and advanced osteopenia.  Moderate spinal stenosis L3-4 and mild spinal stenosis L2-3 and L4-5.  No acute fracture was noted.    S#3. Atrial fibrillation. [place] score 5. EKG as noted above. Home medications includePlavix Toprol  -Holding Plavix for now secondary to above -IV metoprolol with parameters -Monitor #4. Hypertension. Poor control in the emergency department. Patient has been unable to take her antihypertensive medications. They include clonidine,plendil, etoprolol, losartan -We'll convert clonidine to  a patch -iV metoprolol as noted above -When necessary hydralazine  #5. COPD. Not on home oxygen. Stable at baseline.  Oxygen saturation level greater than 90% on room air. Chest x-ray without a cardiopulmonary process. Home medications include Symbicort and theophylline. -Continue Symbicort -Monitor  #6. Chronic kidney disease. Stage III. Creatinine 1.3 on admission.  Improved to 1.2 with slow IV hydration. -Hold nephrotoxins -gentle IV fluids  7 skin tear to the right upper extremity monitor for signs of infection.        DVT prophylaxis:scd Code Status dnr Family Communication:no family in room Disposition Plan: tbd Consultants: none  Procedures:none  Antimicrobials:none  Subjective: Awake and alert she thinks she feels better today than yesterday.  A core tract tube was placed last night. Objective: Vitals:   03/23/18 2313 03/24/18 0000 03/24/18 0300 03/24/18 0430  BP: (!) 147/134 (!) 180/60 125/70 117/61  Pulse:    (!) 116  Resp: 20 19 (!) 21 (!) 29  Temp:    98.1 F (36.7 C)  TempSrc:    Oral  SpO2:    95%  Weight:    33.1 kg (72 lb 15.6 oz)  Height:        Intake/Output Summary (Last 24 hours) at 03/24/2018 1114 Last data filed at 03/24/2018 1108 Gross per 24 hour  Intake 1531.67 ml  Output 500 ml  Net 1031.67 ml   Filed Weights   03/22/18 1056 03/23/18 1021 03/24/18 0430  Weight: 37.6 kg (83 lb) 31 kg (68 lb 5.5 oz) 33.1 kg (72 lb 15.6 oz)    Examination:  General exam: Appears calm and comfortable  Respiratory system: Clear to auscultation. Respiratory effort normal. Cardiovascular system: S1 & S2 heard, RRR. No JVD, murmurs, rubs, gallops or clicks. No pedal edema. Gastrointestinal system: Abdomen is nondistended, soft and nontender. No organomegaly or masses felt. Normal bowel sounds heard. Central nervous system: Alert and oriented. No focal neurological deficits. Extremities: Left upper extremity is bruised and swollen from IV infiltration.  The right upper extremity with a skin tear. Skin: No rashes, lesions or ulcers Psychiatry: Judgement and insight  appear normal. Mood & affect appropriate.     Data Reviewed: I have personally reviewed following labs and imaging studies  CBC: Recent Labs  Lab 03/22/18 1119 03/23/18 0417  WBC 8.0 11.9*  NEUTROABS 5.6  --   HGB 13.6 12.8  HCT 43.7 39.6  MCV 95.0 94.1  PLT 217 062   Basic Metabolic Panel: Recent Labs  Lab 03/22/18 1119 03/23/18 0417  NA 143 141  K 4.7 4.2  CL 102 102  CO2 30 24  GLUCOSE 108* 151*  BUN 34* 34*  CREATININE 1.34* 1.22*  CALCIUM 10.0 9.4   GFR: Estimated Creatinine Clearance: 14.7 mL/min (A) (by C-G formula based on SCr of 1.22 mg/dL (H)). Liver Function Tests: Recent Labs  Lab 03/22/18 1119  AST 22  ALT 15  ALKPHOS 94  BILITOT 0.7  PROT 7.1  ALBUMIN 3.8   Recent Labs  Lab 03/22/18 1119  LIPASE 41   No results for input(s): AMMONIA in the last 168 hours. Coagulation Profile: No results for input(s): INR, PROTIME in the last 168 hours. Cardiac Enzymes: No results for input(s): CKTOTAL, CKMB, CKMBINDEX, TROPONINI in the last 168 hours. BNP (last 3 results) No results for input(s): PROBNP in the last 8760 hours. HbA1C: No results for input(s): HGBA1C in the last 72 hours. CBG: Recent Labs  Lab 03/23/18 2014 03/23/18 3762 03/24/18 0429 03/24/18 8315  GLUCAP 136* 136* 144* 162*   Lipid Profile: No results for input(s): CHOL, HDL, LDLCALC, TRIG, CHOLHDL, LDLDIRECT in the last 72 hours. Thyroid Function Tests: Recent Labs    03/24/18 0910  TSH 2.473   Anemia Panel: No results for input(s): VITAMINB12, FOLATE, FERRITIN, TIBC, IRON, RETICCTPCT in the last 72 hours. Sepsis Labs: No results for input(s): PROCALCITON, LATICACIDVEN in the last 168 hours.  No results found for this or any previous visit (from the past 240 hour(s)).       Radiology Studies: Dg Lumbar Spine Complete  Result Date: 03/22/2018 CLINICAL DATA:  Fall.  Back pain. EXAM: LUMBAR SPINE - COMPLETE 4+ VIEW COMPARISON:  CT 02/14/2012.  Abdomen series  02/14/2012. FINDINGS: Diffuse osteopenia. Multilevel degenerative change lumbar spine and both hips. Multiple mild to moderate thoraco lumbar spine compression fractures are again noted. Similar findings noted on prior exams. If symptoms persist and a superimposed acute injury is suspected MRI can be obtained. IMPRESSION: 1. Multiple mild-to-moderate thoracolumbar spine compression fractures again noted. Similar findings noted on prior exam. If symptoms persist and a superimposed acute injury remains suspected MRI can be obtained. 2.  Diffuse osteopenia degenerative change again noted. Electronically Signed   By: Marcello Moores  Register   On: 03/22/2018 13:47   Dg Elbow Complete Left  Result Date: 03/22/2018 CLINICAL DATA:  [place] with bruising of the left elbow EXAM: LEFT ELBOW - COMPLETE 3+ VIEW COMPARISON:  None. FINDINGS: A true lateral view of the left elbow was not obtained but no definite joint effusion is seen on the images obtained. The bones appear somewhat osteopenic. Alignment is normal. Joint spaces appear normal. No fracture is noted. IMPRESSION: No definite fracture or effusion.  Osteopenia. Electronically Signed   By: Ivar Drape M.D.   On: 03/22/2018 13:46   Ct Head Wo Contrast  Result Date: 03/22/2018 CLINICAL DATA:  Generalized weakness EXAM: CT HEAD WITHOUT CONTRAST TECHNIQUE: Contiguous axial images were obtained from the base of the skull through the vertex without intravenous contrast. COMPARISON:  CT and MRI 02/10/2018 FINDINGS: Brain: Moderate atrophy. Negative for hydrocephalus. Chronic infarct right pons. Mild chronic ischemia in the white matter. Negative for acute infarct, hemorrhage, or mass Vascular: Atherosclerotic calcification. Negative for hyperdense vessel Skull: Negative Sinuses/Orbits: Negative Other: None IMPRESSION: Atrophy and chronic ischemia. Chronic infarct right pons. No acute abnormality. Electronically Signed   By: Franchot Gallo M.D.   On: 03/22/2018 19:19   Ct Lumbar  Spine Wo Contrast  Result Date: 03/22/2018 CLINICAL DATA:  Rib fractures suspected post CPR.  Back pain EXAM: CT LUMBAR SPINE WITHOUT CONTRAST TECHNIQUE: Multidetector CT imaging of the lumbar spine was performed without intravenous contrast administration. Multiplanar CT image reconstructions were also generated. COMPARISON:  CT abdomen pelvis 02/14/2012. Lumbar spine radiographs 03/22/2018 FINDINGS: Segmentation: Normal Alignment: Normal Vertebrae: Diffuse osteopenia, advanced. Moderate compression fracture T12 unchanged. Mild compression fractures L2 and L3 unchanged. Moderate compression fracture L5 unchanged. No acute fracture or mass. Paraspinal and other soft tissues: Atherosclerotic calcification. Abdominal aorta measuring up to 25 mm in diameter. Disc levels: Mild spinal stenosis L2-3. Moderate spinal stenosis L3-4. Mild spinal stenosis L4-5. IMPRESSION: Chronic compression fractures T12, L2, L3, L5.  No acute fracture. Advanced osteopenia. Atherosclerotic aorta measuring up to 25 mm in diameter Multilevel lumbar degenerative change. Mild spinal stenosis L2-3 and L4-5. Moderate spinal stenosis L3-4. Electronically Signed   By: Franchot Gallo M.D.   On: 03/22/2018 19:15        Scheduled Meds: . cloNIDine  0.2 mg Transdermal Weekly  . enoxaparin (LOVENOX) injection  30 mg Subcutaneous Q24H  . metoprolol tartrate  5 mg Intravenous Q8H  . mometasone-formoterol  2 puff Inhalation BID  . rosuvastatin  5 mg Oral QPM   Continuous Infusions: . feeding supplement (JEVITY 1.2 CAL) 50 mL/hr at 03/24/18 0430     LOS: 2 days     Georgette Shell, MD [facility]  If 7PM-7AM, please contact night-coverage [email or web address] Password [email or web address] 03/24/2018, 11:14 AM

## 2018-03-24 NOTE — Progress Notes (Signed)
Patient now in NSR with a rate in the 90's.

## 2018-03-25 ENCOUNTER — Telehealth: Payer: Self-pay | Admitting: Adult Health

## 2018-03-25 ENCOUNTER — Inpatient Hospital Stay (HOSPITAL_COMMUNITY): Payer: Medicare Other

## 2018-03-25 LAB — GLUCOSE, CAPILLARY
GLUCOSE-CAPILLARY: 114 mg/dL — AB (ref 65–99)
Glucose-Capillary: 106 mg/dL — ABNORMAL HIGH (ref 65–99)
Glucose-Capillary: 131 mg/dL — ABNORMAL HIGH (ref 65–99)
Glucose-Capillary: 144 mg/dL — ABNORMAL HIGH (ref 65–99)

## 2018-03-25 MED ORDER — LEVALBUTEROL HCL 0.63 MG/3ML IN NEBU
0.6300 mg | INHALATION_SOLUTION | Freq: Three times a day (TID) | RESPIRATORY_TRACT | Status: DC
Start: 2018-03-25 — End: 2018-03-27
  Administered 2018-03-25 – 2018-03-26 (×4): 0.63 mg via RESPIRATORY_TRACT
  Filled 2018-03-25 (×4): qty 3

## 2018-03-25 MED ORDER — CARVEDILOL 25 MG PO TABS
25.0000 mg | ORAL_TABLET | Freq: Two times a day (BID) | ORAL | Status: DC
  Administered 2018-03-25 – 2018-03-28 (×7): 25 mg via ORAL
  Filled 2018-03-25 (×7): qty 1

## 2018-03-25 MED ORDER — METOPROLOL SUCCINATE ER 25 MG PO TB24
25.0000 mg | ORAL_TABLET | Freq: Two times a day (BID) | ORAL | Status: DC
  Filled 2018-03-25: qty 1

## 2018-03-25 MED ORDER — POTASSIUM CHLORIDE 10 MEQ/100ML IV SOLN
10.0000 meq | INTRAVENOUS | Status: AC
  Administered 2018-03-25 (×3): 10 meq via INTRAVENOUS
  Filled 2018-03-25 (×3): qty 100

## 2018-03-25 NOTE — Care Management Important Message (Signed)
Important Message  Patient Details  Name: Erin Cox MRN: [id number] Date of Birth: 1920/12/22   Medicare Important Message Given:  Yes    [name] P [name] 03/25/2018, 3:01 PM

## 2018-03-25 NOTE — Telephone Encounter (Signed)
Patient was admitted to hospital and they did a swallow study.

## 2018-03-25 NOTE — Plan of Care (Signed)
  Problem: Clinical Measurements: Goal: Will remain free from infection Outcome: Progressing Note:  No s/s of infection noted. Goal: Respiratory complications will improve Outcome: Progressing Note:  No s/s of respiratory complications. Goal: Cardiovascular complication will be avoided Outcome: Progressing Note:  No s/s of cardiovascular complications.   

## 2018-03-25 NOTE — Progress Notes (Signed)
PROGRESS NOTE    Erin Cox  RXV:[id number] DOB: [birth year] DOA: 03/22/2018 PCP: [name], MD   Brief Narrative: 84 y.o.femalewith medical history significantfor atrial fibrillation, COPD not on home oxygen, prior stroke, hypertension,chronic kidney disease,severe protein calorie malnutrition,presents to the emergency department chief complaint generalized weakness and difficulty swallowing. Initial evaluation reveals uncontrolled blood pressure and includes evaluation of speech therapy who recommends nothing by mouth admission for further swallow evaluation.  Information is obtained from the granddaughters and the patient. Patient was recently discharged from his left home where she has 24 7 care. Patient reports this morning she was eating oatmeal and she became choked.She coughed and "gagged" and coughed up white foamy secretions. Also began to complain of pain in her low back. She denies chest pain palpitation shortness of breath. She denies nauseaabdominal pain headache dizziness syncope or near-syncope. He does note that she has had difficulties with swallowing and her granddaughters confirm since her recent hospitalization.family reports during that hospitalization she was evaluated by speech therapy and they recommended a thickening agent for liquids. Knee also reports this morning patient had difficulty bearing weight and complained of low back pain. Her baseline is independent with walker is able to get up and go off of chair couch toilet. Able to ambulate with walker. No recent falls. She denies dysuria hematuria frequency or urgency. She does endorse a decreased oral intake because she has frequent episodes of "choking". She denies diarrhea constipation melena bright red blood per rectum ED Course:in the emergency department she is hypertensive EKG reveals A. Fib with a rate 110 and not hypoxic she is afebrile nontoxic appearing. She is provided with metoprolol 2. He is evaluated  at the bedside by speech therapy as noted above.    Assessment & Plan:   Principal Problem:   Dysphagia Active Problems:   CKD (chronic kidney disease), stage III (HCC)   COPD (chronic obstructive pulmonary disease) (HCC)   Anxiety   Essential hypertension   Compression fracture of lumbar spine, non-traumatic (HCC)   Back pain   Atrial fibrillation ([place])   1. Dysphagia-patient had a stroke last month and was sent out to a skilled nursing facility. At the facility she continued to have dysphagia and choking and vomiting. Her foot was downgraded to pured diet with nectar thick liquids even with that patient continued to have choking episodes. She was discharged from the facility 03/10/2018. She had an episode of aspiration pneumonia while at the facility. She was seen by speech therapy and MBS showed moderate aspiration risk. Patient tolerating dysphagia 2 diet. CT scan of the head shows no acute stroke but it does show atrophy and old stroke right pontine area.  #2.Multiplecompression fracture of the lumbar spine. patient unable to bear weight as of this morning. No recent trauma.CT scan of the lumbar spine show chronic compression fractures T12 L2-L3 L5 and advanced osteopenia. Moderate spinal stenosis L3-4 and mild spinal stenosis L2-3 and L4-5. No acute fracture was noted.   S#3chronic Atrial fibrillation. [place] score 5. EKG as noted above. Home medications includePlavix Toprol .  Restart Plavix.  Continue Toprol.  Cardizem has been ordered yesterday due to rapid A. fib now converted to sinus rhythm.  #4. Hypertension-stable at this time on metoprolol, Cardizem and Catapres patch.    #5. COPD. Not on home oxygen. Stable at baseline. Oxygen saturation level greater than 90% on room air. Chest x-ray without a cardiopulmonary process. Home medications include Symbicort and theophylline. -Continue Symbicort  #6. Chronic  kidney disease. Stage III. Creatinine 1.3 on  admission.Improved to 1.2 with slow IV hydration. -Hold nephrotoxins -gentle IV fluids  7 skin tear to the right upper extremity monitor for signs of infection.     DVT prophylaxis:scd Code Status DO NOT RESUSCITATE Family Communication: No family available Disposition Plan: Plan discharge in 24 to 48 hours Consultants: None Procedures: Modified barium swallow and core tract tube placement Antimicrobials: None  Subjective feels well awake alert denies chest pain shortness of breath or nausea able to tolerate p.o. intake.   Objective: Vitals:   03/24/18 2034 03/24/18 2041 03/25/18 0406 03/25/18 0830  BP:  117/70 (!) 151/88   Pulse:  (!) 108 97   Resp:  (!) 25 19   Temp:  98.1 F (36.7 C) 97.9 F (36.6 C)   TempSrc:  Oral Oral   SpO2: 98% 97% 96% 96%  Weight:   36.9 kg (81 lb 5.6 oz)   Height:        Intake/Output Summary (Last 24 hours) at 03/25/2018 1200 Last data filed at 03/25/2018 1109 Gross per 24 hour  Intake 1358.33 ml  Output 1200 ml  Net 158.33 ml   Filed Weights   03/24/18 1405 03/25/18 0406  Weight: 37 kg (81 lb 9.6 oz) 36.9 kg (81 lb 5.6 oz)    Examination:  General exam: Appears calm and comfortable  Respiratory system: Clear to auscultation. Respiratory effort normal. Cardiovascular system: S1 & S2 heard, RRR. No JVD, murmurs, rubs, gallops or clicks. No pedal edema. Gastrointestinal system: Abdomen is nondistended, soft and nontender. No organomegaly or masses felt. Normal bowel sounds heard. Central nervous system: Alert and oriented. No focal neurological deficits. Extremities: Right upper extremity skin tear noted, left upper extremity swelling noted secondary to IV infiltration. Skin: No rashes, lesions or ulcers Psychiatry: Judgement and insight appear normal. Mood & affect appropriate.     Data Reviewed: I have personally reviewed following labs and imaging studies  CBC: Recent Labs  Lab 03/22/18 1119 03/23/18 0417  03/24/18 0910  WBC 8.0 11.9* 8.6  NEUTROABS 5.6  --   --   HGB 13.6 12.8 11.4*  HCT 43.7 39.6 35.7*  MCV 95.0 94.1 94.2  PLT 217 283 875   Basic Metabolic Panel: Recent Labs  Lab 03/22/18 1119 03/23/18 0417 03/24/18 0910  NA 143 141 145  K 4.7 4.2 3.5  CL 102 102 111  CO2 30 24 25   GLUCOSE 108* 151* 140*  BUN 34* 34* 40*  CREATININE 1.34* 1.22* 1.18*  CALCIUM 10.0 9.4 8.9   GFR: Estimated Creatinine Clearance: 17 mL/min (A) (by C-G formula based on SCr of 1.18 mg/dL (H)). Liver Function Tests: Recent Labs  Lab 03/22/18 1119  AST 22  ALT 15  ALKPHOS 94  BILITOT 0.7  PROT 7.1  ALBUMIN 3.8   Recent Labs  Lab 03/22/18 1119  LIPASE 41   No results for input(s): AMMONIA in the last 168 hours. Coagulation Profile: No results for input(s): INR, PROTIME in the last 168 hours. Cardiac Enzymes: No results for input(s): CKTOTAL, CKMB, CKMBINDEX, TROPONINI in the last 168 hours. BNP (last 3 results) No results for input(s): PROBNP in the last 8760 hours. HbA1C: No results for input(s): HGBA1C in the last 72 hours. CBG: Recent Labs  Lab 03/24/18 2040 03/25/18 0006 03/25/18 0403 03/25/18 0738 03/25/18 1101  GLUCAP 173* 144* 131* 106* 114*   Lipid Profile: No results for input(s): CHOL, HDL, LDLCALC, TRIG, CHOLHDL, LDLDIRECT in the last 72 hours.  Thyroid Function Tests: Recent Labs    03/24/18 0910  TSH 2.473   Anemia Panel: No results for input(s): VITAMINB12, FOLATE, FERRITIN, TIBC, IRON, RETICCTPCT in the last 72 hours. Sepsis Labs: No results for input(s): PROCALCITON, LATICACIDVEN in the last 168 hours.  No results found for this or any previous visit (from the past 240 hour(s)).       Radiology Studies: Dg Swallowing Func-speech Pathology  Result Date: 03/24/2018 Objective Swallowing Evaluation: Type of Study: Bedside Swallow Evaluation  Patient Details Name: Elyna Pangilinan MRN: [id number] Date of Birth: 09/22/1921 Today's Date: 03/24/2018 Time:  SLP Start Time (ACUTE ONLY): 1310 -SLP Stop Time (ACUTE ONLY): 1329 SLP Time Calculation (min) (ACUTE ONLY): 19 min Past Medical History: Past Medical History: Diagnosis Date . Asthma  . Back pain  . Carotid artery obstruction  . Compression fracture of lumbar spine, non-traumatic ([place])  . COPD (chronic obstructive pulmonary disease) ([place])  . Hypertension  . Stroke ([place])  . TIA (transient ischemic attack)  Past Surgical History: Past Surgical History: Procedure Laterality Date . APPENDECTOMY   HPI: 84 year old female with history of prior stroke, asthma, COPD, hypertension. Presented to ED from home via transportation from neighbor for evaluation of choking sensation during breakfast.Pt with recent admission for 01/2018 with MRI (4/18) revealed a moderate-sized acute nonhemorrhagic pontine infarct (no swallow assessment done).  Per daughters' report, pt discharged to [facility] on 02/14/18 had aspiration event that led to aspiration PNA on 02/16/18 with diet downgraded at bedside to puree with nectar thick liquids. Per report, pt continues with coughing on purees and nectar with facility planned instrumental swallow study but pt discharged home before facility could schedule. She discharged home on 5/16 with 24 hour care givers and continued overt s/s of aspiration with puree and nectar thick liquids (coughing per daughters' report).  Subjective: alert, pleasant Assessment / Plan / Recommendation CHL IP CLINICAL IMPRESSIONS 03/24/2018 Clinical Impression Moderately deceased oral control, cohesion, manipulation with delayed mastication and transit (with solid) with premature spill resulting in aspiration with nectar. Nectar thick fell into laryngeal vestibule before the swallow and subsequently was aspirated x 1 silently. Swallow was initiated timely with majority of swallows with one late initiation to pyriform sinuses with honey thick. Thin liquid transiently and briefly penetrated with one episode. An independent or  cued second swallow cleared most of pharyngeal residue. Pt's granddaughter-in-laws present and reported she takes large consecutive sips even when cued and continues to cough with nectar. Recommend initiating honey thick liquids with therapy to increase oral control with small sips to mitigate premature loss with goal of returning to nectar thick and eventually thin if able.  SLP Visit Diagnosis Dysphagia, oropharyngeal phase (R13.12) Attention and concentration deficit following -- Frontal lobe and executive function deficit following -- Impact on safety and function Moderate aspiration risk;Risk for inadequate nutrition/hydration   CHL IP TREATMENT RECOMMENDATION 03/24/2018 Treatment Recommendations Therapy as outlined in treatment plan below   Prognosis 03/24/2018 Prognosis for Safe Diet Advancement Fair Barriers to Reach Goals Severity of deficits;Time post onset Barriers/Prognosis Comment -- CHL IP DIET RECOMMENDATION 03/24/2018 SLP Diet Recommendations Dysphagia 2 (Fine chop) solids;Honey thick liquids Liquid Administration via Cup;No straw Medication Administration Crushed with puree Compensations Slow rate;Small sips/bites;Multiple dry swallows after each bite/sip Postural Changes Seated upright at 90 degrees   CHL IP OTHER RECOMMENDATIONS 03/24/2018 Recommended Consults -- Oral Care Recommendations Oral care BID Other Recommendations --   CHL IP FOLLOW UP RECOMMENDATIONS 03/24/2018 Follow up Recommendations Skilled Nursing facility  CHL IP FREQUENCY AND DURATION 03/24/2018 Speech Therapy Frequency (ACUTE ONLY) min 2x/week Treatment Duration 2 weeks      CHL IP ORAL PHASE 03/24/2018 Oral Phase Impaired Oral - Pudding Teaspoon -- Oral - Pudding Cup -- Oral - Honey Teaspoon -- Oral - Honey Cup Reduced posterior propulsion;Decreased bolus cohesion Oral - Nectar Teaspoon -- Oral - Nectar Cup Decreased bolus cohesion;Premature spillage Oral - Nectar Straw -- Oral - Thin Teaspoon -- Oral - Thin Cup Decreased bolus  cohesion Oral - Thin Straw -- Oral - Puree -- Oral - Mech Soft Delayed oral transit;Weak lingual manipulation Oral - Regular -- Oral - Multi-Consistency -- Oral - Pill -- Oral Phase - Comment --  CHL IP PHARYNGEAL PHASE 03/24/2018 Pharyngeal Phase Impaired Pharyngeal- Pudding Teaspoon -- Pharyngeal -- Pharyngeal- Pudding Cup -- Pharyngeal -- Pharyngeal- Honey Teaspoon -- Pharyngeal -- Pharyngeal- Honey Cup Delayed swallow initiation-pyriform sinuses;Pharyngeal residue - valleculae;Reduced epiglottic inversion Pharyngeal -- Pharyngeal- Nectar Teaspoon -- Pharyngeal -- Pharyngeal- Nectar Cup Pharyngeal residue - valleculae;Penetration/Aspiration before swallow Pharyngeal Material enters airway, passes BELOW cords without attempt by patient to eject out (silent aspiration) Pharyngeal- Nectar Straw -- Pharyngeal -- Pharyngeal- Thin Teaspoon -- Pharyngeal -- Pharyngeal- Thin Cup Penetration/Aspiration during swallow Pharyngeal Material enters airway, remains ABOVE vocal cords then ejected out Pharyngeal- Thin Straw -- Pharyngeal -- Pharyngeal- Puree -- Pharyngeal -- Pharyngeal- Mechanical Soft Pharyngeal residue - valleculae;Reduced epiglottic inversion Pharyngeal -- Pharyngeal- Regular -- Pharyngeal -- Pharyngeal- Multi-consistency -- Pharyngeal -- Pharyngeal- Pill -- Pharyngeal -- Pharyngeal Comment --  CHL IP CERVICAL ESOPHAGEAL PHASE 03/24/2018 Cervical Esophageal Phase WFL Pudding Teaspoon -- Pudding Cup -- Honey Teaspoon -- Honey Cup -- Nectar Teaspoon -- Nectar Cup -- Nectar Straw -- Thin Teaspoon -- Thin Cup -- Thin Straw -- Puree -- Mechanical Soft -- Regular -- Multi-consistency -- Pill -- Cervical Esophageal Comment -- No flowsheet data found. Houston Siren 03/24/2018, 2:20 PM Orbie Pyo Colvin Caroli.Ed CCC-SLP Pager [phone]                   Scheduled Meds: . cloNIDine  0.2 mg Transdermal Weekly  . diltiazem  30 mg Oral Q6H  . enoxaparin (LOVENOX) injection  30 mg Subcutaneous Q24H  . metoprolol  tartrate  5 mg Intravenous Q8H  . mometasone-formoterol  2 puff Inhalation BID  . rosuvastatin  5 mg Oral QPM   Continuous Infusions: . feeding supplement (JEVITY 1.2 CAL) 50 mL/hr at 03/24/18 0430     LOS: 3 days     Georgette Shell, MD [facility]  If 7PM-7AM, please contact night-coverage [email or web address] Password [email or web address] 03/25/2018, 12:00 PM

## 2018-03-25 NOTE — Progress Notes (Signed)
Patient's granddaughter-in-law was concerned that the patient appeared to have labored breathing after eating dinner.  Called Respiratory Therapist to assess patient.  Paged Dr. Oda Kilts x-ray ordered.  Patient appears comfortable, oxygen saturation on room air is >96%.

## 2018-03-25 NOTE — Progress Notes (Signed)
Nutrition Follow-up  DOCUMENTATION CODES:   Underweight, Severe malnutrition in context of chronic illness  INTERVENTION:    Magic cup TID with meals, each supplement provides 290 kcal and 9 grams of protein  NUTRITION DIAGNOSIS:   Severe Malnutrition related to chronic illness(CVA/dysphagia) as evidenced by percent weight loss, energy intake < or equal to 75% for > or equal to 1 month, severe fat depletion, severe muscle depletion.  Ongoing  GOAL:   Patient will meet greater than or equal to 90% of their needs  Unmet  MONITOR:   Diet advancement, Weight trends, TF tolerance, I & O's, Labs  ASSESSMENT:   Patient with PMH significant for COPD, HTN, and stroke. Recently admitted 4/19 for stroke. Presents this admission with dysphagia from unknown etiology and generalized weakness.  Patient pulled Cortrak tube out last night, so TF is off. Diet has been advanced to dysphagia 2 with honey thick liquids S/P MBS with SLP on 5/30. Patient is tolerating PO's well, but intake is poor. Labs and medications reviewed.  Diet Order:   Diet Order           DIET DYS 2 Room service appropriate? Yes; Fluid consistency: Honey Thick  Diet effective now          EDUCATION NEEDS:   Education needs have been addressed  Skin:  Skin Assessment: Skin Integrity Issues: Skin Integrity Issues:: Other (Comment) Other: L arm wound  Last BM:  PTA  Height:   Ht Readings from Last 1 Encounters:  03/23/18 5' (1.524 m)    Weight:   Wt Readings from Last 1 Encounters:  03/25/18 81 lb 5.6 oz (36.9 kg)    Ideal Body Weight:  45.5 kg  BMI:  Body mass index is 15.89 kg/m.  Estimated Nutritional Needs:   Kcal:  1250-1450 kcal   Protein:  65-75 g  Fluid:  >1.2 L/day    Molli Barrows, RD, LDN, [place] Pager [phone] After Hours Pager [phone]

## 2018-03-25 NOTE — Progress Notes (Addendum)
Spiritual Care consult for Advanced Directive Education acknowledged. There is a need to have a Chaplain led family conference with the involved stakeholders. For the purpose of attaining clarity on some of the family dynamics and to encourage involved and invested people to lift up the PT's care and wishes above all else. The pt. Is well supported in her current living arrangement and home care plan. In my assessment what would be helpful is if the lines of communication between the family in state and the Pt.'s daughter who is out of state could be deepened by some empathy and mutual affirmation. [name] is as healthy as she can be, and gets the support she is needing in the season. This result has come about with cooperation, love, willingness and service. That is what all persons concerned want. Let's focus on this and set history and current vexation aside. I will brief team of Chaplains and see about getting a Chaplain up there for family pastoral conversation in the evening.

## 2018-03-25 NOTE — Progress Notes (Signed)
  Speech Language Pathology Treatment: Dysphagia  Patient Details Name: Erin Cox MRN: [id number] DOB: [birth year] Today's Date: 03/25/2018 Time: 2202-5427 SLP Time Calculation (min) (ACUTE ONLY): 36 min  Assessment / Plan / Recommendation Clinical Impression  Pt was seen for f/u after MBS on previous date. Since that time diet has been downgraded to Dys 1 textures due to coughing, difficulty chewing the minced textures on her previously recommended Dys 2 diet. Her family shares that she has been consuming all pureed food at home since d/c from SNF and that she has had improved intake since making this change. Pt declined purees today but was agreeable to drinking about 2 ounces of honey thick liquids. SLP provided Min cues for small, single sips. No overt signs of aspiration were noted and her vocal quality remained clear. Pt did have changes in her respirations characterized by increased WOB. When asked by her family what was bothering her, she indicated that she was in pain from her back (RN had just administered pain meds). Per MBS report on previous date, pt should tolerate honey thick liquids when consumed using precautions. Therefore, would continue Dys 1 diet and honey thick liquids with careful use of strategies outlined below, also closely monitoring for any further changes in respirations. RN also notified.   HPI HPI: 84 year old female with history of prior stroke, asthma, COPD, hypertension. Presented to ED from home via transportation from neighbor for evaluation of choking sensation during breakfast.Pt with recent admission for 01/2018 with MRI (4/18) revealed a moderate-sized acute nonhemorrhagic pontine infarct (no swallow assessment done).  Per daughters' report, pt discharged to [facility] on 02/14/18 had aspiration event that led to aspiration PNA on 02/16/18 with diet downgraded at bedside to puree with nectar thick liquids. Per report, pt continues with coughing on purees and nectar  with facility planned instrumental swallow study but pt discharged home before facility could schedule. She discharged home on 5/16 with 24 hour care givers and continued overt s/s of aspiration with puree and nectar thick liquids (coughing per daughters' report).       SLP Plan  Continue with current plan of care       Recommendations  Diet recommendations: Dysphagia 1 (puree);Honey-thick liquid Liquids provided via: Cup;No straw Medication Administration: Crushed with puree Supervision: Patient able to self feed;Full supervision/cueing for compensatory strategies Compensations: Slow rate;Small sips/bites;Multiple dry swallows after each bite/sip Postural Changes and/or Swallow Maneuvers: Seated upright 90 degrees;Upright 30-60 min after meal                Oral Care Recommendations: Oral care BID Follow up Recommendations: Skilled Nursing facility;Home health SLP;24 hour supervision/assistance SLP Visit Diagnosis: Dysphagia, oropharyngeal phase (R13.12) Plan: Continue with current plan of care       GO                Germain Osgood 03/25/2018, 5:31 PM  Germain Osgood, M.A. CCC-SLP [phone]

## 2018-03-25 NOTE — Telephone Encounter (Signed)
Noted! Thank you

## 2018-03-25 NOTE — Progress Notes (Signed)
Report received from night shift RN.  She stated that upon entering the patient's room at 0630, she found the Cortrack feeding tube in the bed.  Patient had apparently pulled out the tube within the previous hour.  Notified Dr. Zigmund Daniel.

## 2018-03-26 LAB — GLUCOSE, CAPILLARY: Glucose-Capillary: 142 mg/dL — ABNORMAL HIGH (ref 65–99)

## 2018-03-26 MED ORDER — DIPHENHYDRAMINE HCL 25 MG PO CAPS
25.0000 mg | ORAL_CAPSULE | Freq: Once | ORAL | Status: AC
  Administered 2018-03-26: 25 mg via ORAL
  Filled 2018-03-26: qty 1

## 2018-03-26 NOTE — Plan of Care (Signed)
  Problem: Clinical Measurements: Goal: Ability to maintain clinical measurements within normal limits will improve Outcome: Progressing   Problem: Nutrition: Goal: Adequate nutrition will be maintained Outcome: Progressing   

## 2018-03-26 NOTE — Plan of Care (Signed)
  Problem: Clinical Measurements: Goal: Ability to maintain clinical measurements within normal limits will improve Outcome: Progressing Note:  VSS.  NAD noted.   Problem: Coping: Goal: Level of anxiety will decrease Outcome: Progressing Note:  No s/s of anxiety noted.

## 2018-03-26 NOTE — Progress Notes (Signed)
PROGRESS NOTE    Erin Cox  WUJ:[id number] DOB: 1920/12/16 DOA: 03/22/2018 PCP: [name], MD   Brief Narrative:84 y.o.femalewith medical history significantfor atrial fibrillation, COPD not on home oxygen, prior stroke, hypertension,chronic kidney disease,severe protein calorie malnutrition,presents to the emergency department chief complaint generalized weakness and difficulty swallowing. Initial evaluation reveals uncontrolled blood pressure and includes evaluation of speech therapy who recommends nothing by mouth admission for further swallow evaluation.  Information is obtained from the granddaughters and the patient. Patient was recently discharged from his left home where she has 24 7 care. Patient reports this morning she was eating oatmeal and she became choked.She coughed and "gagged" and coughed up white foamy secretions. Also began to complain of pain in her low back. She denies chest pain palpitation shortness of breath. She denies nauseaabdominal pain headache dizziness syncope or near-syncope. He does note that she has had difficulties with swallowing and her granddaughters confirm since her recent hospitalization.family reports during that hospitalization she was evaluated by speech therapy and they recommended a thickening agent for liquids. Knee also reports this morning patient had difficulty bearing weight and complained of low back pain. Her baseline is independent with walker is able to get up and go off of chair couch toilet. Able to ambulate with walker. No recent falls. She denies dysuria hematuria frequency or urgency. She does endorse a decreased oral intake because she has frequent episodes of "choking". She denies diarrhea constipation melena bright red blood per rectum ED Course:in the emergency department she is hypertensive EKG reveals A. Fib with a rate 110 and not hypoxic she is afebrile nontoxic appearing. She is provided with metoprolol 2. He is evaluated at  the bedside by speech therapy as noted above.     Assessment & Plan:   Principal Problem:   Dysphagia Active Problems:   CKD (chronic kidney disease), stage III (HCC)   COPD (chronic obstructive pulmonary disease) (HCC)   Anxiety   Essential hypertension   Compression fracture of lumbar spine, non-traumatic (HCC)   Back pain   Atrial fibrillation ([place])  1. Dysphagia-patient had a stroke last month and was sent out to a skilled nursing facility. At the facility she continued to have dysphagia and choking and vomiting. Her foot was downgraded to pured diet with nectar thick liquids even with that patient continued to have choking episodes. She was discharged from the facility 03/10/2018. She had an episode of aspiration pneumonia while at the facility. She was seen by speech therapy and MBS showed moderate aspiration risk. Patient tolerating dysphagia 2 diet. CT scan of the head shows no acute stroke but it does show atrophy and old stroke right pontine area.  #2.Multiplecompression fracture of the lumbar spine. patient unable to bear weight as of this morning. No recent trauma.CT scan of the lumbar spine show chronic compression fractures T12 L2-L3 L5 and advanced osteopenia. Moderate spinal stenosis L3-4 and mild spinal stenosis L2-3 and L4-5. No acute fracture was noted.   S#3chronic Atrial fibrillation. [place] score 5. EKG as noted above. Home medications includePlavix Toprol .  Restart Plavix.  Continue Toprol.  Cardizem has been ordered yesterday due to rapid A. fib now converted to sinus rhythm.  #4. Hypertension-stable at this time on metoprolol, Cardizem and Catapres patch.    #5. COPD. Not on home oxygen. Stable at baseline. Oxygen saturation level greater than 90% on room air. Chest x-ray without a cardiopulmonary process. Home medications include Symbicort and theophylline. -Continue Symbicort  #6. Chronic kidney  disease. Stage III. Creatinine 1.3 on  admission.Improved to 1.2 with slow IV hydration. -Hold nephrotoxins -gentle IV fluids  7skin tear to the right upper extremity monitor for signs of infection.  8 FEN patient with failure to thrive on.  Diet choked again last night with weight loss.  Family requesting palliative consult.        DVT prophylaxis: SCD Code Status: DNR Family Communication: Discussed with granddaughter plan was to discharge her home today with the family Disposition Plan:.  Now family requesting for palliative care consult. Consultants: Palliative consult pending Procedures: Modified barium swallow and core tract tube placement Antimicrobials: None Subjective: Resting in bed in no acute distress she denies any nausea vomiting or diarrhea family reports that she had a choking episode last night and did not eat much.   Objective: Vitals:   03/26/18 0516 03/26/18 0829 03/26/18 0858 03/26/18 1159  BP: (!) 150/92  126/62 (!) 154/80  Pulse: 76  84   Resp: 14     Temp: 98.3 F (36.8 C)     TempSrc: Oral     SpO2: 96% 97%    Weight: 30.4 kg (67 lb 1.6 oz)     Height:        Intake/Output Summary (Last 24 hours) at 03/26/2018 1542 Last data filed at 03/26/2018 1233 Gross per 24 hour  Intake 1030 ml  Output 300 ml  Net 730 ml   Filed Weights   03/24/18 1405 03/25/18 0406 03/26/18 0516  Weight: 37 kg (81 lb 9.6 oz) 36.9 kg (81 lb 5.6 oz) 30.4 kg (67 lb 1.6 oz)    Examination:  General exam: Appears calm and comfortable  Respiratory system: Clear to auscultation. Respiratory effort normal. Cardiovascular system: S1 & S2 heard, RRR. No JVD, murmurs, rubs, gallops or clicks. No pedal edema. Gastrointestinal system: Abdomen is nondistended, soft and nontender. No organomegaly or masses felt. Normal bowel sounds heard. Central nervous system: Alert and oriented. No focal neurological deficits. Extremities no edema Skin skin tear right upper extremity, left upper extremity edema  noted. Psychiatry: Judgement and insight appear normal. Mood & affect appropriate.     Data Reviewed: I have personally reviewed following labs and imaging studies  CBC: Recent Labs  Lab 03/22/18 1119 03/23/18 0417 03/24/18 0910  WBC 8.0 11.9* 8.6  NEUTROABS 5.6  --   --   HGB 13.6 12.8 11.4*  HCT 43.7 39.6 35.7*  MCV 95.0 94.1 94.2  PLT 217 283 573   Basic Metabolic Panel: Recent Labs  Lab 03/22/18 1119 03/23/18 0417 03/24/18 0910  NA 143 141 145  K 4.7 4.2 3.5  CL 102 102 111  CO2 30 24 25   GLUCOSE 108* 151* 140*  BUN 34* 34* 40*  CREATININE 1.34* 1.22* 1.18*  CALCIUM 10.0 9.4 8.9   GFR: Estimated Creatinine Clearance: 14 mL/min (A) (by C-G formula based on SCr of 1.18 mg/dL (H)). Liver Function Tests: Recent Labs  Lab 03/22/18 1119  AST 22  ALT 15  ALKPHOS 94  BILITOT 0.7  PROT 7.1  ALBUMIN 3.8   Recent Labs  Lab 03/22/18 1119  LIPASE 41   No results for input(s): AMMONIA in the last 168 hours. Coagulation Profile: No results for input(s): INR, PROTIME in the last 168 hours. Cardiac Enzymes: No results for input(s): CKTOTAL, CKMB, CKMBINDEX, TROPONINI in the last 168 hours. BNP (last 3 results) No results for input(s): PROBNP in the last 8760 hours. HbA1C: No results for input(s): HGBA1C in the last  72 hours. CBG: Recent Labs  Lab 03/24/18 2040 03/25/18 0006 03/25/18 0403 03/25/18 0738 03/25/18 1101  GLUCAP 173* 144* 131* 106* 114*   Lipid Profile: No results for input(s): CHOL, HDL, LDLCALC, TRIG, CHOLHDL, LDLDIRECT in the last 72 hours. Thyroid Function Tests: Recent Labs    03/24/18 0910  TSH 2.473   Anemia Panel: No results for input(s): VITAMINB12, FOLATE, FERRITIN, TIBC, IRON, RETICCTPCT in the last 72 hours. Sepsis Labs: No results for input(s): PROCALCITON, LATICACIDVEN in the last 168 hours.  No results found for this or any previous visit (from the past 240 hour(s)).       Radiology Studies: Dg Chest Port 1  View  Result Date: 03/25/2018 CLINICAL DATA:  Shortness of breath EXAM: PORTABLE CHEST 1 VIEW COMPARISON:  03/22/2018 FINDINGS: Cardiac shadow is stable. Aortic calcifications are again seen. Lungs are well aerated without focal infiltrate or sizable effusion. No acute bony abnormality is seen. IMPRESSION: No acute abnormality noted. Electronically Signed   By: Inez Catalina M.D.   On: 03/25/2018 20:10        Scheduled Meds: . carvedilol  25 mg Oral BID WC  . cloNIDine  0.2 mg Transdermal Weekly  . diltiazem  30 mg Oral Q6H  . levalbuterol  0.63 mg Nebulization TID  . mometasone-formoterol  2 puff Inhalation BID  . rosuvastatin  5 mg Oral QPM   Continuous Infusions:   LOS: 4 days     Georgette Shell, MD [facility]  If 7PM-7AM, please contact night-coverage [email or web address] Password [facility] 03/26/2018, 3:42 PM

## 2018-03-27 DIAGNOSIS — M545 Low back pain: Secondary | ICD-10-CM

## 2018-03-27 DIAGNOSIS — Z515 Encounter for palliative care: Secondary | ICD-10-CM

## 2018-03-27 DIAGNOSIS — Z7189 Other specified counseling: Secondary | ICD-10-CM

## 2018-03-27 DIAGNOSIS — F419 Anxiety disorder, unspecified: Secondary | ICD-10-CM

## 2018-03-27 LAB — GLUCOSE, CAPILLARY
GLUCOSE-CAPILLARY: 120 mg/dL — AB (ref 65–99)
GLUCOSE-CAPILLARY: 141 mg/dL — AB (ref 65–99)
GLUCOSE-CAPILLARY: 125 mg/dL — AB (ref 65–99)
Glucose-Capillary: 123 mg/dL — ABNORMAL HIGH (ref 65–99)
Glucose-Capillary: 122 mg/dL — ABNORMAL HIGH (ref 65–99)

## 2018-03-27 MED ORDER — OXYCODONE HCL 20 MG/ML PO CONC: 2.0000 mg | ORAL | Status: DC | PRN

## 2018-03-27 MED ORDER — LORAZEPAM 0.5 MG PO TABS: 0.2500 mg | ORAL_TABLET | Freq: Four times a day (QID) | ORAL | Status: DC | PRN

## 2018-03-27 MED ORDER — POLYETHYLENE GLYCOL 3350 17 G PO PACK: 17.0000 g | PACK | Freq: Every day | ORAL | Status: DC | PRN

## 2018-03-27 MED ORDER — LEVALBUTEROL HCL 0.63 MG/3ML IN NEBU
0.6300 mg | INHALATION_SOLUTION | Freq: Three times a day (TID) | RESPIRATORY_TRACT | Status: DC | PRN
  Administered 2018-03-27: 0.63 mg via RESPIRATORY_TRACT
  Filled 2018-03-27: qty 3

## 2018-03-27 MED ORDER — ACETAMINOPHEN 160 MG/5ML PO SOLN
650.0000 mg | Freq: Four times a day (QID) | ORAL | Status: DC
  Administered 2018-03-27 – 2018-03-28 (×4): 650 mg via ORAL
  Filled 2018-03-27 (×4): qty 20.3

## 2018-03-27 MED ORDER — LIDOCAINE 5 % EX PTCH
2.0000 | MEDICATED_PATCH | CUTANEOUS | Status: DC
  Administered 2018-03-27 – 2018-03-28 (×2): 2 via TRANSDERMAL
  Filled 2018-03-27 (×2): qty 2

## 2018-03-27 MED ORDER — OXYCODONE HCL 5 MG/5ML PO SOLN
2.5000 mg | ORAL | Status: DC | PRN
  Administered 2018-03-28: 2.5 mg via ORAL
  Filled 2018-03-27: qty 5

## 2018-03-27 NOTE — Plan of Care (Signed)
  Problem: Clinical Measurements: Goal: Ability to maintain clinical measurements within normal limits will improve 03/27/2018 0328 by Irish Lack, RN Outcome: Progressing 03/26/2018 1950 by Irish Lack, RN Outcome: Progressing   Problem: Clinical Measurements: Goal: Cardiovascular complication will be avoided Outcome: Progressing

## 2018-03-27 NOTE — Consult Note (Signed)
Consultation Note Date: 03/27/2018   Patient Name: Erin Cox  DOB: Jun 29, 1921  MRN: [id number]  Age / Sex: 84 y.o., female  PCP: [name], MD Referring Physician: Georgette Shell, MD  Reason for Consultation: Establishing goals of care  HPI/Patient Profile: 84 y.o. female  with past medical history of atrial fibrilliation, COPD, CVA, HTN, CKD, protein calorie malnutrition admitted on 03/22/2018 with generalized weakness and difficulty swallowing. Workup revealed severe dysphagia and uncontrolled hypertension, and multiple compression fractures. Palliative medicine consulted for [place].    Clinical Assessment and Goals of Care:  I have reviewed medical records including EPIC notes, labs and imaging, received report from Dr. Rodena Piety, assessed the patient and then met at the bedside along with patient's daughter, granddaughter-in-law, and Yolanda Bonine- Ricky  to discuss diagnosis prognosis, GOC, EOL wishes, disposition and options.  Patient is oriented to person, and place. She has some expressive aphasia from her previous stroke, but is able to participate in [place] discussion.  I introduced Palliative Medicine as specialized medical care for people living with serious illness. It focuses on providing relief from the symptoms and stress of a serious illness. The goal is to improve quality of life for both the patient and the family.  As far as functional and nutritional status- prior to admission she was able to ambulate short distances in her home with a walker. Her po intake has decreased dramatically since her stroke. She requires asssitance with all ADL's including feeding. She has had significant weight loss and now weighs only 70lbs- chart review shows 15lb weight loss since previous admission in April.     Her family describes that over the last month she has become increasingly withdrawn, conversing less,  sleeping more during the day, eating less.    We discussed their current illness and what it means in the larger context of their on-going co-morbidities.  Natural disease trajectory and expectations at EOL were discussed. I asked patient her thoughts on dying and she says, "it will all be ok, it will work out".   The difference between aggressive medical intervention and comfort care was considered in light of the patient's goals of care. When aggressive medical intervention vs. Comfort care was described and offered- patient declared a resounding "yes, yes, yes" to the offer of focusing on quality and comfort in her home without rehospitalization with the exception of if symptoms of comfort could not be controlled at home. Even if this meant that her time of life would be shortened. All family are in agreement and support patient's GOC.   Advanced directives, concepts specific to code status, artifical feeding and hydration, and rehospitalization were considered and discussed. Patient is currently DNR. She has completed HCPOA and living will selecting her grandson as HCPOA with Chaplaincy service and is awaiting for it to be notarized.   Hospice and Palliative Care services outpatient were explained and offered. Family and patient elect to receive Hospice services at home.  Questions and concerns were addressed. The family was encouraged to call  with questions or concerns.   Symptoms were reviewed- patient has ongoing pain in low back- receiving Toradol which provides some relief. She gets constipated with opioids but otherwise has used these in the past with no problems. She has difficulty sleeping at night and takes klonazepam at home, but has difficulty swallowing the pills. No other complaints.   Primary Decision Maker PATIENT - with help from family    SUMMARY OF RECOMMENDATIONS -Case manager referral for hospice services at home -Scheduled liquid tylenol '650mg'$  q6hrs for pain relief -  Concentrated oxy IR liquid 2.'5mg'$  sublingual q4hr prn for pain -lorazepam liquid .'25mg'$  SL q6 hr prn for anxiety, sleep -lidoderm patches to low back for pain- leave on for 12 hrs, remove for 12 hrs, repeat -miralax daily prn for constipation   Code Status/Advance Care Planning:  DNR  Palliative Prophylaxis:   Delirium Protocol and Turn Reposition  Additional Recommendations (Limitations, Scope, Preferences):  Avoid Hospitalization and No Artificial Feeding  Prognosis:    < 3 months d/t severe dysphagia resulting from CVA significant weight loss, decline in level of functioning  Discharge Planning: Home with Hospice  Primary Diagnoses: Present on Admission: . Anxiety . CKD (chronic kidney disease), stage III ([place]) . Essential hypertension . Back pain . COPD (chronic obstructive pulmonary disease) ([place]) . Compression fracture of lumbar spine, non-traumatic ([place]) . Atrial fibrillation ([place])   I have reviewed the medical record, interviewed the patient and family, and examined the patient. The following aspects are pertinent.  Past Medical History:  Diagnosis Date  . Asthma   . Back pain   . Carotid artery obstruction   . Compression fracture of lumbar spine, non-traumatic ([place])   . COPD (chronic obstructive pulmonary disease) ([place])   . Hypertension   . Stroke ([place])   . TIA (transient ischemic attack)    Social History   Socioeconomic History  . Marital status: Widowed    Spouse name: Not on file  . Number of children: Not on file  . Years of education: Not on file  . Highest education level: Not on file  Occupational History  . Not on file  Social Needs  . Financial resource strain: Not on file  . Food insecurity:    Worry: Not on file    Inability: Not on file  . Transportation needs:    Medical: Not on file    Non-medical: Not on file  Tobacco Use  . Smoking status: Never Smoker  . Smokeless tobacco: Never Used  Substance and Sexual Activity  .  Alcohol use: No  . Drug use: No  . Sexual activity: Not on file  Lifestyle  . Physical activity:    Days per week: Not on file    Minutes per session: Not on file  . Stress: Not on file  Relationships  . Social connections:    Talks on phone: Not on file    Gets together: Not on file    Attends religious service: Not on file    Active member of club or organization: Not on file    Attends meetings of clubs or organizations: Not on file    Relationship status: Not on file  Other Topics Concern  . Not on file  Social History Narrative  . Not on file   Family History  Problem Relation Age of Onset  . Hypertension Mother   . Heart attack Father    Scheduled Meds: . carvedilol  25 mg Oral BID WC  . cloNIDine  0.2 mg Transdermal Weekly  . diltiazem  30 mg Oral Q6H  . mometasone-formoterol  2 puff Inhalation BID  . rosuvastatin  5 mg Oral QPM   Continuous Infusions: PRN Meds:.acetaminophen **OR** acetaminophen, bisacodyl, hydrALAZINE, ketorolac, levalbuterol, metoprolol tartrate, ondansetron **OR** ondansetron (ZOFRAN) IV, RESOURCE THICKENUP CLEAR, sodium phosphate Medications Prior to Admission:  Prior to Admission medications   Medication Sig Start Date End Date Taking? Authorizing Provider  Ascorbic Acid (VITAMIN C) 1000 MG tablet Take 500 mg by mouth daily.    Yes [provider]  aspirin EC 81 MG tablet Take 81 mg by mouth every evening.   Yes [provider]  budesonide-formoterol (SYMBICORT) 160-4.5 MCG/ACT inhaler Inhale 2 puffs into the lungs 2 (two) times daily.   Yes [provider]  Calcium Citrate (CITRACAL PO) Take 2 tablets by mouth daily.   Yes [provider]  carbamazepine (TEGRETOL) 100 MG chewable tablet Chew 100 mg by mouth 3 (three) times daily as needed (for headaches).    Yes [provider]  clonazePAM (KLONOPIN) 0.5 MG tablet Take 1 tablet (0.5 mg total) by mouth at bedtime. For anxiety 02/14/18  Yes Mikhail,  Perry Hall, DO  cloNIDine (CATAPRES) 0.2 MG tablet Take 0.2 mg by mouth at bedtime.   Yes [provider]  clopidogrel (PLAVIX) 75 MG tablet Take 75 mg by mouth daily.   Yes [provider]  co-enzyme Q-10 50 MG capsule Take 2 capsules (100 mg total) by mouth daily. 02/14/18  Yes Mikhail, Velta Addison, DO  felodipine (PLENDIL) 5 MG 24 hr tablet Take 5 mg by mouth daily.    Yes [provider]  losartan (COZAAR) 100 MG tablet Take 100 mg by mouth daily.   Yes [provider]  metoprolol succinate (TOPROL-XL) 25 MG 24 hr tablet Take 25 mg by mouth 2 (two) times daily.   Yes [provider]  OLANZapine (ZYPREXA) 2.5 MG tablet Take 2.5 mg by mouth every evening.  03/11/18  Yes [provider]  potassium gluconate 595 MG TABS Take 595 mg by mouth daily.   Yes [provider]  rosuvastatin (CRESTOR) 5 MG tablet Take 1 tablet (5 mg total) by mouth daily at 6 PM. Patient taking differently: Take 5 mg by mouth every evening. 5pm 02/14/18  Yes Mikhail, Velta Addison, DO  theophylline (THEODUR) 300 MG 12 hr tablet Take 150 mg by mouth daily.   Yes [provider]  vitamin B-12 (CYANOCOBALAMIN) 1000 MCG tablet Take 1,000 mcg by mouth daily.   Yes [provider]  vitamin E 400 UNIT capsule Take 400 Units by mouth daily.   Yes [provider]   Allergies  Allergen Reactions  . Lipitor [Atorvastatin] Other (See Comments)    Muscle contractions and fuzzy feeling  . Neosporin [Neomycin-Bacitracin Zn-Polymyx] Hives  . Other Hives and Swelling    Reaction to walnuts  . Penicillins Hives    Has patient had a PCN reaction causing immediate rash, facial/tongue/throat swelling, SOB or lightheadedness with hypotension: Yes Has patient had a PCN reaction causing severe rash involving mucus membranes or skin necrosis: No Has patient had a PCN reaction that required hospitalization: No Has patient had a PCN reaction occurring within the last 10  years: Yes If all of the above answers are "NO", then may proceed with Cephalosporin use.  Marland Kitchen Petrolatum Hives  . Statins Other (See Comments)    Muscle contractions and fuzzy feeling  . Sulfa Antibiotics Other (See Comments)    Made  asthma worse  . Iodine Rash   Review of Systems  Constitutional: Positive for activity change, appetite change, fatigue and unexpected weight change.  Respiratory: Positive for choking.   Genitourinary: Negative for difficulty urinating.  Musculoskeletal: Positive for back pain.  Psychiatric/Behavioral: Positive for sleep disturbance. The patient is nervous/anxious.     Physical Exam  Constitutional: She is oriented to person, place, and time.  cachexic  Cardiovascular: Normal rate and regular rhythm.  Pulmonary/Chest: Effort normal and breath sounds normal.  Neurological: She is alert and oriented to person, place, and time.  Expressive aphasia  Skin: Skin is warm. There is pallor.  Left arm with significant bruising- fam reports from IV  Psychiatric: She has a normal mood and affect. Her behavior is normal.  Nursing note and vitals reviewed.   Vital Signs: BP (!) 144/84   Pulse 79   Temp 98.6 F (37 C) (Oral)   Resp (!) 22   Ht 5' (1.524 m)   Wt 31.9 kg (70 lb 6.4 oz)   SpO2 97%   BMI 13.75 kg/m  Pain Scale: 0-10   Pain Score: 8    SpO2: SpO2: 97 % O2 Device:SpO2: 97 % O2 Flow Rate: .   IO: Intake/output summary:   Intake/Output Summary (Last 24 hours) at 03/27/2018 1122 Last data filed at 03/27/2018 1002 Gross per 24 hour  Intake 437 ml  Output 400 ml  Net 37 ml    LBM: Last BM Date: (PTA) Baseline Weight: Weight: 37.6 kg (83 lb) Most recent weight: Weight: 31.9 kg (70 lb 6.4 oz)     Palliative Assessment/Data: PPS: 20%     Thank you for this consult. Palliative medicine will continue to follow and assist as needed.   Time In: 1000 Time Out: 1200 Time Total: 120 mins Greater than 50%  of this time was spent counseling  and coordinating care related to the above assessment and plan.  Signed by: Mariana Kaufman, AGNP-C Palliative Medicine    Please contact Palliative Medicine Team phone at [phone] for questions and concerns.  For individual provider: See Amion             .

## 2018-03-27 NOTE — Progress Notes (Signed)
PROGRESS NOTE    Erin Cox  FGH:[id number] DOB: January 06, 1921 DOA: 03/22/2018 PCP: [name], MD  Brief [phone] y.o.femalewith medical history significantfor atrial fibrillation, COPD not on home oxygen, prior stroke, hypertension,chronic kidney disease,severe protein calorie malnutrition,presents to the emergency department chief complaint generalized weakness and difficulty swallowing. Initial evaluation reveals uncontrolled blood pressure and includes evaluation of speech therapy who recommends nothing by mouth admission for further swallow evaluation.  Information is obtained from the granddaughters and the patient. Patient was recently discharged from his left home where she has 24 7 care. Patient reports this morning she was eating oatmeal and she became choked.She coughed and "gagged" and coughed up white foamy secretions. Also began to complain of pain in her low back. She denies chest pain palpitation shortness of breath. She denies nauseaabdominal pain headache dizziness syncope or near-syncope. He does note that she has had difficulties with swallowing and her granddaughters confirm since her recent hospitalization.family reports during that hospitalization she was evaluated by speech therapy and they recommended a thickening agent for liquids. Knee also reports this morning patient had difficulty bearing weight and complained of low back pain. Her baseline is independent with walker is able to get up and go off of chair couch toilet. Able to ambulate with walker. No recent falls. She denies dysuria hematuria frequency or urgency. She does endorse a decreased oral intake because she has frequent episodes of "choking". She denies diarrhea constipation melena bright red blood per rectum ED Course:in the emergency department she is hypertensive EKG reveals A. Fib with a rate 110 and not hypoxic she is afebrile nontoxic appearing. She is provided with metoprolol 2. He is evaluated at  the bedside by speech therapy as noted above.    Assessment & Plan:   Principal Problem:   Dysphagia Active Problems:   CKD (chronic kidney disease), stage III (HCC)   COPD (chronic obstructive pulmonary disease) (HCC)   Anxiety   Essential hypertension   Compression fracture of lumbar spine, non-traumatic (HCC)   Back pain   Atrial fibrillation ([place])  1. Dysphagia-patient had a stroke last month and was sent out to a skilled nursing facility. At the facility she continued to have dysphagia and choking and vomiting. Her foot was downgraded to pured diet with nectar thick liquids even with that patient continued to have choking episodes. She was discharged from the facility 03/10/2018. She had an episode of aspiration pneumonia while at the facility. She was seen by speech therapy and MBS showed moderate aspiration risk.Patienttolerating dysphagia 2 diet.CT scan of the head shows no acute stroke but it does show atrophy and old stroke right pontine area.  #2.Multiplecompression fracture of the lumbar spine. patient unable to bear weight as of this morning. No recent trauma.CT scan of the lumbar spine show chronic compression fractures T12 L2-L3 L5 and advanced osteopenia. Moderate spinal stenosis L3-4 and mild spinal stenosis L2-3 and L4-5. No acute fracture was noted.   S#3chronicAtrial fibrillation. [place] score 5. EKG as noted above. Home medications includePlavix Toprol .Restart Plavix. Continue Toprol. Cardizem has been ordered yesterday due to rapid A. fib now converted to sinus rhythm.  #4. Hypertension-stable at this time on metoprolol, Cardizem and Catapres patch.  #5. COPD. Not on home oxygen. Stable at baseline. Oxygen saturation level greater than 90% on room air. Chest x-ray without a cardiopulmonary process. Home medications include Symbicort and theophylline. -Continue Symbicort  7skin tear to the right upper extremity monitor for signs of  infection.  8  FEN patient with failure to thrive on.  Diet choked again last night with weight loss.  Family requesting palliative consult.  Patient with very minimal food intake overnight and this morning.  Will consult case management to arrange if we can place her in a residential hospice facility.  Family does not want to take her home because of the amount of care the patient does have at home.  Her prognosis is very poor with limited food intake.      DVT prophylaxis: scd Code Status: DO NOT RESUSCITATE Family Communication: discussed with granddaughter and daughter in the room. Disposition Plan: Palliative versus hospice none  Consultants: None  Procedures: None Antimicrobials: None  Subjective: Patient is resting in bed in no acute distress  Objective: Vitals:   03/26/18 2328 03/27/18 0453 03/27/18 0616 03/27/18 0756  BP: 133/78 (!) 155/99 (!) 167/90   Pulse:  79    Resp:  (!) 22    Temp:  98.6 F (37 C)    TempSrc:  Oral    SpO2:  97%  97%  Weight:  31.9 kg (70 lb 6.4 oz)    Height:        Intake/Output Summary (Last 24 hours) at 03/27/2018 0938 Last data filed at 03/27/2018 0100 Gross per 24 hour  Intake 427 ml  Output 400 ml  Net 27 ml   Filed Weights   03/25/18 0406 03/26/18 0516 03/27/18 0453  Weight: 36.9 kg (81 lb 5.6 oz) 30.4 kg (67 lb 1.6 oz) 31.9 kg (70 lb 6.4 oz)    Examination:  General exam: Appears calm and comfortable  Respiratory system: Clear to auscultation. Respiratory effort normal. Cardiovascular system: S1 & S2 heard, RRR. No JVD, murmurs, rubs, gallops or clicks. No pedal edema. Gastrointestinal system: Abdomen is nondistended, soft and nontender. No organomegaly or masses felt. Normal bowel sounds heard. Central nervous system: Alert and oriented. No focal neurological deficits. Extremities: Trace edema decreasing edema in the left upper extremity skin tear in the right upper extremity with decreasing edema. Skin: No rashes, lesions  or ulcers Psychiatry: Judgement and insight appear normal. Mood & affect appropriate.     Data Reviewed: I have personally reviewed following labs and imaging studies  CBC: Recent Labs  Lab 03/22/18 1119 03/23/18 0417 03/24/18 0910  WBC 8.0 11.9* 8.6  NEUTROABS 5.6  --   --   HGB 13.6 12.8 11.4*  HCT 43.7 39.6 35.7*  MCV 95.0 94.1 94.2  PLT 217 283 440   Basic Metabolic Panel: Recent Labs  Lab 03/22/18 1119 03/23/18 0417 03/24/18 0910  NA 143 141 145  K 4.7 4.2 3.5  CL 102 102 111  CO2 30 24 25   GLUCOSE 108* 151* 140*  BUN 34* 34* 40*  CREATININE 1.34* 1.22* 1.18*  CALCIUM 10.0 9.4 8.9   GFR: Estimated Creatinine Clearance: 14.7 mL/min (A) (by C-G formula based on SCr of 1.18 mg/dL (H)). Liver Function Tests: Recent Labs  Lab 03/22/18 1119  AST 22  ALT 15  ALKPHOS 94  BILITOT 0.7  PROT 7.1  ALBUMIN 3.8   Recent Labs  Lab 03/22/18 1119  LIPASE 41   No results for input(s): AMMONIA in the last 168 hours. Coagulation Profile: No results for input(s): INR, PROTIME in the last 168 hours. Cardiac Enzymes: No results for input(s): CKTOTAL, CKMB, CKMBINDEX, TROPONINI in the last 168 hours. BNP (last 3 results) No results for input(s): PROBNP in the last 8760 hours. HbA1C: No results for input(s): HGBA1C in  the last 72 hours. CBG: Recent Labs  Lab 03/25/18 0738 03/25/18 1101 03/26/18 2249 03/27/18 0622 03/27/18 0751  GLUCAP 106* 114* 142* 123* 122*   Lipid Profile: No results for input(s): CHOL, HDL, LDLCALC, TRIG, CHOLHDL, LDLDIRECT in the last 72 hours. Thyroid Function Tests: No results for input(s): TSH, T4TOTAL, FREET4, T3FREE, THYROIDAB in the last 72 hours. Anemia Panel: No results for input(s): VITAMINB12, FOLATE, FERRITIN, TIBC, IRON, RETICCTPCT in the last 72 hours. Sepsis Labs: No results for input(s): PROCALCITON, LATICACIDVEN in the last 168 hours.  No results found for this or any previous visit (from the past 240 hour(s)).        Radiology Studies: Dg Chest Port 1 View  Result Date: 03/25/2018 CLINICAL DATA:  Shortness of breath EXAM: PORTABLE CHEST 1 VIEW COMPARISON:  03/22/2018 FINDINGS: Cardiac shadow is stable. Aortic calcifications are again seen. Lungs are well aerated without focal infiltrate or sizable effusion. No acute bony abnormality is seen. IMPRESSION: No acute abnormality noted. Electronically Signed   By: Inez Catalina M.D.   On: 03/25/2018 20:10        Scheduled Meds: . carvedilol  25 mg Oral BID WC  . cloNIDine  0.2 mg Transdermal Weekly  . diltiazem  30 mg Oral Q6H  . mometasone-formoterol  2 puff Inhalation BID  . rosuvastatin  5 mg Oral QPM   Continuous Infusions:   LOS: 5 days     Georgette Shell, MD [facility] If 7PM-7AM, please contact night-coverage [email or web address] Password [facility] 03/27/2018, 9:38 AM

## 2018-03-28 LAB — GLUCOSE, CAPILLARY
GLUCOSE-CAPILLARY: 119 mg/dL — AB (ref 65–99)
Glucose-Capillary: 100 mg/dL — ABNORMAL HIGH (ref 65–99)
Glucose-Capillary: 112 mg/dL — ABNORMAL HIGH (ref 65–99)
Glucose-Capillary: 118 mg/dL — ABNORMAL HIGH (ref 65–99)
Glucose-Capillary: 122 mg/dL — ABNORMAL HIGH (ref 65–99)

## 2018-03-28 MED ORDER — LIDOCAINE 5 % EX PTCH: 2.0000 | MEDICATED_PATCH | CUTANEOUS | 0 refills | Status: AC

## 2018-03-28 MED ORDER — CARVEDILOL 25 MG PO TABS: 25.0000 mg | ORAL_TABLET | Freq: Two times a day (BID) | ORAL | 0 refills | Status: AC

## 2018-03-28 MED ORDER — DILTIAZEM HCL 30 MG PO TABS: 60.0000 mg | ORAL_TABLET | Freq: Two times a day (BID) | ORAL | 0 refills | Status: AC

## 2018-03-28 MED ORDER — LORAZEPAM 0.5 MG PO TABS: 0.2500 mg | ORAL_TABLET | Freq: Four times a day (QID) | ORAL | 0 refills | Status: AC | PRN

## 2018-03-28 MED ORDER — OXYCODONE HCL 5 MG/5ML PO SOLN: 2.5000 mg | ORAL | 0 refills | Status: AC | PRN

## 2018-03-28 NOTE — Care Management Note (Addendum)
Case Management Note  Patient Details  Name: Erin Cox MRN: [id number] Date of Birth: 04-14-1921  Subjective/Objective:  Pt presented for Dysphagia back pain and generalized weakness. PTA from home with support of family. Plan will be to return home with Hospice Services & 24 hour supervision that family has arranged. Pt has DME 3n1 and shower chair. Pt will need DME Hospital Bed (Delivered Prior to Patients Arrival), Over-bed table and Light weight wheel chair.       Action/Plan: CM discussed Hospice Choice with patient and family. Agency List Provided and family had used [facility] in the past and wants to utilize again. Referral made and SOC to begin within 24-48 hours of transition home. Pt will need ambulance transport home. NO 02 needs at this time. Hospice to evaluate further equipment once they make initial visit. No further needs from CM at this time.   Expected Discharge Date:  03/28/18               Expected Discharge Plan:  Home w Hospice Care  In-House Referral:  NA  Discharge planning Services  CM Consult  Post Acute Care Choice:  Hospice Choice offered to:  Patient, Adult Children  DME Arranged:  Hospital bed, Overbed table, Lightweight manual wheelchair with seat cushion DME Agency:  Hospice and [place]:  RN [facility] Agency:  Hospice and [place]  Status of Service:  Completed, signed off  If discussed at [employer], dates discussed:    Additional Comments: 1422 03-28-18 Jacqlyn Krauss, RN,BSN [phone] CM did ask if granddaughter could call Staff RN once Hospital Bed has been delivered. Staff RN to call [facility] @ [phone] and after 5 pm call [phone] once bed set up. No further needs from CM @ this time.  Bethena Roys, RN 03/28/2018, 10:39 AM

## 2018-03-28 NOTE — Discharge Summary (Signed)
Physician Discharge Summary  Erin Cox XTG:[id number] DOB: [birth year] DOA: 03/22/2018  PCP: [name], MD  Admit date: 03/22/2018 Discharge date: 03/28/2018  Admitted From: Home Disposition: Home with hospice Recommendations for Outpatient Follow-up: Patient being discharged home with hospice and comfort care.   Home Health: None Equipment/Devices none Discharge Condition: Hospice CODE STATUS DO NOT RESUSCITATE Diet recommendation:.  Nectar thick comfort food Brief/Interim Summary:84 y.o.femalewith medical history significantfor atrial fibrillation, COPD not on home oxygen, prior stroke, hypertension,chronic kidney disease,severe protein calorie malnutrition,presents to the emergency department chief complaint generalized weakness and difficulty swallowing. Initial evaluation reveals uncontrolled blood pressure and includes evaluation of speech therapy who recommends nothing by mouth admission for further swallow evaluation.  Information is obtained from the granddaughters and the patient. Patient was recently discharged from his left home where she has 24 7 care. Patient reports this morning she was eating oatmeal and she became choked.She coughed and "gagged" and coughed up white foamy secretions. Also began to complain of pain in her low back. She denies chest pain palpitation shortness of breath. She denies nauseaabdominal pain headache dizziness syncope or near-syncope. He does note that she has had difficulties with swallowing and her granddaughters confirm since her recent hospitalization.family reports during that hospitalization she was evaluated by speech therapy and they recommended a thickening agent for liquids. Knee also reports this morning patient had difficulty bearing weight and complained of low back pain. Her baseline is independent with walker is able to get up and go off of chair couch toilet. Able to ambulate with walker. No recent falls. She denies dysuria hematuria  frequency or urgency. She does endorse a decreased oral intake because she has frequent episodes of "choking". She denies diarrhea constipation melena bright red blood per rectum ED Course:in the emergency department she is hypertensive EKG reveals A. Fib with a rate 110 and not hypoxic she is afebrile nontoxic appearing. She is provided with metoprolol 2. He is evaluated at the bedside by speech therapy as noted above.     Discharge Diagnoses:  Principal Problem:   Dysphagia Active Problems:   CKD (chronic kidney disease), stage III (HCC)   COPD (chronic obstructive pulmonary disease) (HCC)   Anxiety   Essential hypertension   Compression fracture of lumbar spine, non-traumatic (HCC)   Back pain   Atrial fibrillation [facility])   Advance care planning   Goals of care, counseling/discussion   Palliative care by specialist 1. Dysphagia-patient had a stroke last month and was sent out to a skilled nursing facility. At the facility she continued to have dysphagia and choking and vomiting. Her foot was downgraded to pured diet with nectar thick liquids even with that patient continued to have choking episodes. She was discharged from the facility 03/10/2018. She had an episode of aspiration pneumonia while at the facility. She was seen by speech therapy and MBS showed moderate aspiration risk.Patient was started on dysphagia 2 diet.  She did not tolerate that either she  had episodes of choking.CT scan of the head shows no acute stroke but it does show atrophy and old stroke right pontine area.  Patient and the family wanted to see palliative care and hospice referral was done and patient is being discharged to home with hospice care.  #2.Multiplecompression fracture of the lumbar spine. patient unable to bear weight as of this morning. No recent trauma.CT scan of the lumbar spine show chronic compression fractures T12 L2-L3 L5 and advanced osteopenia. Moderate spinal stenosis L3-4 and  mild  spinal stenosis L2-3 and L4-5. No acute fracture was noted.  Continue morphine and lidocaine patch.  S#3chronic paroxysmalarial fibrillation.  Continue metoprolol and Cardizem for now as long as she can swallow.  When she comes to a point where she cannot swallow stop all p.o. meds.  I have stopped her Plavix. #4. Hypertension-stable at this time on metoprolol, Cardizem and Catapres patch.  #5. COPD. Not on home oxygen. Stable at baseline  7skin tear to the right upper extremity stable  8 FEN patient with failure to thrive on-patient to be discharged home with hospice.     Discharge Instructions  Discharge Instructions    Diet - low sodium heart healthy   Complete by:  As directed    Increase activity slowly   Complete by:  As directed      Allergies as of 03/28/2018      Reactions   Lipitor [atorvastatin] Other (See Comments)   Muscle contractions and fuzzy feeling   Neosporin [neomycin-bacitracin Zn-polymyx] Hives   Other Hives, Swelling   Reaction to walnuts   Penicillins Hives   Has patient had a PCN reaction causing immediate rash, facial/tongue/throat swelling, SOB or lightheadedness with hypotension: Yes Has patient had a PCN reaction causing severe rash involving mucus membranes or skin necrosis: No Has patient had a PCN reaction that required hospitalization: No Has patient had a PCN reaction occurring within the last 10 years: Yes If all of the above answers are "NO", then may proceed with Cephalosporin use.   Petrolatum Hives   Statins Other (See Comments)   Muscle contractions and fuzzy feeling   Sulfa Antibiotics Other (See Comments)   Made asthma worse   Iodine Rash      Medication List    STOP taking these medications   aspirin EC 81 MG tablet   carbamazepine 100 MG chewable tablet Commonly known as:  TEGRETOL   CITRACAL PO   clonazePAM 0.5 MG tablet Commonly known as:  KLONOPIN   clopidogrel 75 MG tablet Commonly known as:  PLAVIX    co-enzyme Q-10 50 MG capsule   felodipine 5 MG 24 hr tablet Commonly known as:  PLENDIL   losartan 100 MG tablet Commonly known as:  COZAAR   potassium gluconate 595 (99 K) MG Tabs tablet   rosuvastatin 5 MG tablet Commonly known as:  CRESTOR   theophylline 300 MG 12 hr tablet Commonly known as:  THEODUR   vitamin B-12 1000 MCG tablet Commonly known as:  CYANOCOBALAMIN   vitamin C 1000 MG tablet   vitamin E 400 UNIT capsule     TAKE these medications   budesonide-formoterol 160-4.5 MCG/ACT inhaler Commonly known as:  SYMBICORT Inhale 2 puffs into the lungs 2 (two) times daily.   carvedilol 25 MG tablet Commonly known as:  COREG Take 1 tablet (25 mg total) by mouth 2 (two) times daily with a meal.   cloNIDine 0.2 MG tablet Commonly known as:  CATAPRES Take 0.2 mg by mouth at bedtime.   diltiazem 30 MG tablet Commonly known as:  CARDIZEM Take 2 tablets (60 mg total) by mouth 2 (two) times daily.   lidocaine 5 % Commonly known as:  LIDODERM Place 2 patches onto the skin daily. Remove & Discard patch within 12 hours or as directed by MD   LORazepam 0.5 MG tablet Commonly known as:  ATIVAN Place 0.5 tablets (0.25 mg total) under the tongue every 6 (six) hours as needed for anxiety or sleep (PLEASE OFFER AT NIGHT  FOR SLEEP).   metoprolol succinate 25 MG 24 hr tablet Commonly known as:  TOPROL-XL Take 25 mg by mouth 2 (two) times daily.   OLANZapine 2.5 MG tablet Commonly known as:  ZYPREXA Take 2.5 mg by mouth every evening.   oxyCODONE 5 MG/5ML solution Commonly known as:  ROXICODONE Take 2.5 mLs (2.5 mg total) by mouth every 4 (four) hours as needed for moderate pain or severe pain.       Allergies  Allergen Reactions  . Lipitor [Atorvastatin] Other (See Comments)    Muscle contractions and fuzzy feeling  . Neosporin [Neomycin-Bacitracin Zn-Polymyx] Hives  . Other Hives and Swelling    Reaction to walnuts  . Penicillins Hives    Has patient had a  PCN reaction causing immediate rash, facial/tongue/throat swelling, SOB or lightheadedness with hypotension: Yes Has patient had a PCN reaction causing severe rash involving mucus membranes or skin necrosis: No Has patient had a PCN reaction that required hospitalization: No Has patient had a PCN reaction occurring within the last 10 years: Yes If all of the above answers are "NO", then may proceed with Cephalosporin use.  Marland Kitchen Petrolatum Hives  . Statins Other (See Comments)    Muscle contractions and fuzzy feeling  . Sulfa Antibiotics Other (See Comments)    Made asthma worse  . Iodine Rash    Consultations: Palliative care Procedures/Studies: Dg Lumbar Spine Complete  Result Date: 03/22/2018 CLINICAL DATA:  Fall.  Back pain. EXAM: LUMBAR SPINE - COMPLETE 4+ VIEW COMPARISON:  CT 02/14/2012.  Abdomen series 02/14/2012. FINDINGS: Diffuse osteopenia. Multilevel degenerative change lumbar spine and both hips. Multiple mild to moderate thoraco lumbar spine compression fractures are again noted. Similar findings noted on prior exams. If symptoms persist and a superimposed acute injury is suspected MRI can be obtained. IMPRESSION: 1. Multiple mild-to-moderate thoracolumbar spine compression fractures again noted. Similar findings noted on prior exam. If symptoms persist and a superimposed acute injury remains suspected MRI can be obtained. 2.  Diffuse osteopenia degenerative change again noted. Electronically Signed   By: Marcello Moores  Register   On: 03/22/2018 13:47   Dg Elbow Complete Left  Result Date: 03/22/2018 CLINICAL DATA:  [place] with bruising of the left elbow EXAM: LEFT ELBOW - COMPLETE 3+ VIEW COMPARISON:  None. FINDINGS: A true lateral view of the left elbow was not obtained but no definite joint effusion is seen on the images obtained. The bones appear somewhat osteopenic. Alignment is normal. Joint spaces appear normal. No fracture is noted. IMPRESSION: No definite fracture or effusion.   Osteopenia. Electronically Signed   By: Ivar Drape M.D.   On: 03/22/2018 13:46   Ct Head Wo Contrast  Result Date: 03/22/2018 CLINICAL DATA:  Generalized weakness EXAM: CT HEAD WITHOUT CONTRAST TECHNIQUE: Contiguous axial images were obtained from the base of the skull through the vertex without intravenous contrast. COMPARISON:  CT and MRI 02/10/2018 FINDINGS: Brain: Moderate atrophy. Negative for hydrocephalus. Chronic infarct right pons. Mild chronic ischemia in the white matter. Negative for acute infarct, hemorrhage, or mass Vascular: Atherosclerotic calcification. Negative for hyperdense vessel Skull: Negative Sinuses/Orbits: Negative Other: None IMPRESSION: Atrophy and chronic ischemia. Chronic infarct right pons. No acute abnormality. Electronically Signed   By: Franchot Gallo M.D.   On: 03/22/2018 19:19   Ct Lumbar Spine Wo Contrast  Result Date: 03/22/2018 CLINICAL DATA:  Rib fractures suspected post CPR.  Back pain EXAM: CT LUMBAR SPINE WITHOUT CONTRAST TECHNIQUE: Multidetector CT imaging of the lumbar spine was performed without  intravenous contrast administration. Multiplanar CT image reconstructions were also generated. COMPARISON:  CT abdomen pelvis 02/14/2012. Lumbar spine radiographs 03/22/2018 FINDINGS: Segmentation: Normal Alignment: Normal Vertebrae: Diffuse osteopenia, advanced. Moderate compression fracture T12 unchanged. Mild compression fractures L2 and L3 unchanged. Moderate compression fracture L5 unchanged. No acute fracture or mass. Paraspinal and other soft tissues: Atherosclerotic calcification. Abdominal aorta measuring up to 25 mm in diameter. Disc levels: Mild spinal stenosis L2-3. Moderate spinal stenosis L3-4. Mild spinal stenosis L4-5. IMPRESSION: Chronic compression fractures T12, L2, L3, L5.  No acute fracture. Advanced osteopenia. Atherosclerotic aorta measuring up to 25 mm in diameter Multilevel lumbar degenerative change. Mild spinal stenosis L2-3 and L4-5. Moderate  spinal stenosis L3-4. Electronically Signed   By: Franchot Gallo M.D.   On: 03/22/2018 19:15   Dg Chest Port 1 View  Result Date: 03/25/2018 CLINICAL DATA:  Shortness of breath EXAM: PORTABLE CHEST 1 VIEW COMPARISON:  03/22/2018 FINDINGS: Cardiac shadow is stable. Aortic calcifications are again seen. Lungs are well aerated without focal infiltrate or sizable effusion. No acute bony abnormality is seen. IMPRESSION: No acute abnormality noted. Electronically Signed   By: Inez Catalina M.D.   On: 03/25/2018 20:10   Dg Chest Portable 1 View  Result Date: 03/22/2018 CLINICAL DATA:  Choking sensation.  Difficulty swallowing. EXAM: PORTABLE CHEST 1 VIEW COMPARISON:  Two-view chest x-ray 02/10/2018.  CTA chest 02/02/2013. FINDINGS: The heart is enlarged. Atherosclerotic changes are present at the aorta. Emphysematous changes are present in the lungs bilaterally. There is scarring at both lung apices. No edema or effusion is present. There is no evidence for aspiration or focal airspace disease. IMPRESSION: 1. No acute cardiopulmonary disease. 2.  Aortic Atherosclerosis (ICD10-I70.0). 3.  Emphysema (ICD10-J43.9). Electronically Signed   By: San Morelle M.D.   On: 03/22/2018 11:05   Dg Swallowing Func-speech Pathology  Result Date: 03/24/2018 Objective Swallowing Evaluation: Type of Study: Bedside Swallow Evaluation  Patient Details Name: Erin Cox MRN: [id number] Date of Birth: [birth year] Today's Date: 03/24/2018 Time: SLP Start Time (ACUTE ONLY): 1310 -SLP Stop Time (ACUTE ONLY): 1329 SLP Time Calculation (min) (ACUTE ONLY): 19 min Past Medical History: Past Medical History: Diagnosis Date . Asthma  . Back pain  . Carotid artery obstruction  . Compression fracture of lumbar spine, non-traumatic ([place])  . COPD (chronic obstructive pulmonary disease) ([place])  . Hypertension  . Stroke ([place])  . TIA (transient ischemic attack)  Past Surgical History: Past Surgical History: Procedure Laterality Date .  APPENDECTOMY   HPI: 84 year old female with history of prior stroke, asthma, COPD, hypertension. Presented to ED from home via transportation from neighbor for evaluation of choking sensation during breakfast.Pt with recent admission for 01/2018 with MRI (4/18) revealed a moderate-sized acute nonhemorrhagic pontine infarct (no swallow assessment done).  Per daughters' report, pt discharged to [facility] on 02/14/18 had aspiration event that led to aspiration PNA on 02/16/18 with diet downgraded at bedside to puree with nectar thick liquids. Per report, pt continues with coughing on purees and nectar with facility planned instrumental swallow study but pt discharged home before facility could schedule. She discharged home on 5/16 with 24 hour care givers and continued overt s/s of aspiration with puree and nectar thick liquids (coughing per daughters' report).  Subjective: alert, pleasant Assessment / Plan / Recommendation CHL IP CLINICAL IMPRESSIONS 03/24/2018 Clinical Impression Moderately deceased oral control, cohesion, manipulation with delayed mastication and transit (with solid) with premature spill resulting in aspiration with nectar. Nectar thick fell into laryngeal vestibule before  the swallow and subsequently was aspirated x 1 silently. Swallow was initiated timely with majority of swallows with one late initiation to pyriform sinuses with honey thick. Thin liquid transiently and briefly penetrated with one episode. An independent or cued second swallow cleared most of pharyngeal residue. Pt's granddaughter-in-laws present and reported she takes large consecutive sips even when cued and continues to cough with nectar. Recommend initiating honey thick liquids with therapy to increase oral control with small sips to mitigate premature loss with goal of returning to nectar thick and eventually thin if able.  SLP Visit Diagnosis Dysphagia, oropharyngeal phase (R13.12) Attention and concentration deficit following --  Frontal lobe and executive function deficit following -- Impact on safety and function Moderate aspiration risk;Risk for inadequate nutrition/hydration   CHL IP TREATMENT RECOMMENDATION 03/24/2018 Treatment Recommendations Therapy as outlined in treatment plan below   Prognosis 03/24/2018 Prognosis for Safe Diet Advancement Fair Barriers to Reach Goals Severity of deficits;Time post onset Barriers/Prognosis Comment -- CHL IP DIET RECOMMENDATION 03/24/2018 SLP Diet Recommendations Dysphagia 2 (Fine chop) solids;Honey thick liquids Liquid Administration via Cup;No straw Medication Administration Crushed with puree Compensations Slow rate;Small sips/bites;Multiple dry swallows after each bite/sip Postural Changes Seated upright at 90 degrees   CHL IP OTHER RECOMMENDATIONS 03/24/2018 Recommended Consults -- Oral Care Recommendations Oral care BID Other Recommendations --   CHL IP FOLLOW UP RECOMMENDATIONS 03/24/2018 Follow up Recommendations Skilled Nursing facility   [facility] IP FREQUENCY AND DURATION 03/24/2018 Speech Therapy Frequency (ACUTE ONLY) min 2x/week Treatment Duration 2 weeks      CHL IP ORAL PHASE 03/24/2018 Oral Phase Impaired Oral - Pudding Teaspoon -- Oral - Pudding Cup -- Oral - Honey Teaspoon -- Oral - Honey Cup Reduced posterior propulsion;Decreased bolus cohesion Oral - Nectar Teaspoon -- Oral - Nectar Cup Decreased bolus cohesion;Premature spillage Oral - Nectar Straw -- Oral - Thin Teaspoon -- Oral - Thin Cup Decreased bolus cohesion Oral - Thin Straw -- Oral - Puree -- Oral - Mech Soft Delayed oral transit;Weak lingual manipulation Oral - Regular -- Oral - Multi-Consistency -- Oral - Pill -- Oral Phase - Comment --  CHL IP PHARYNGEAL PHASE 03/24/2018 Pharyngeal Phase Impaired Pharyngeal- Pudding Teaspoon -- Pharyngeal -- Pharyngeal- Pudding Cup -- Pharyngeal -- Pharyngeal- Honey Teaspoon -- Pharyngeal -- Pharyngeal- Honey Cup Delayed swallow initiation-pyriform sinuses;Pharyngeal residue - valleculae;Reduced  epiglottic inversion Pharyngeal -- Pharyngeal- Nectar Teaspoon -- Pharyngeal -- Pharyngeal- Nectar Cup Pharyngeal residue - valleculae;Penetration/Aspiration before swallow Pharyngeal Material enters airway, passes BELOW cords without attempt by patient to eject out (silent aspiration) Pharyngeal- Nectar Straw -- Pharyngeal -- Pharyngeal- Thin Teaspoon -- Pharyngeal -- Pharyngeal- Thin Cup Penetration/Aspiration during swallow Pharyngeal Material enters airway, remains ABOVE vocal cords then ejected out Pharyngeal- Thin Straw -- Pharyngeal -- Pharyngeal- Puree -- Pharyngeal -- Pharyngeal- Mechanical Soft Pharyngeal residue - valleculae;Reduced epiglottic inversion Pharyngeal -- Pharyngeal- Regular -- Pharyngeal -- Pharyngeal- Multi-consistency -- Pharyngeal -- Pharyngeal- Pill -- Pharyngeal -- Pharyngeal Comment --  CHL IP CERVICAL ESOPHAGEAL PHASE 03/24/2018 Cervical Esophageal Phase WFL Pudding Teaspoon -- Pudding Cup -- Honey Teaspoon -- Honey Cup -- Nectar Teaspoon -- Nectar Cup -- Nectar Straw -- Thin Teaspoon -- Thin Cup -- Thin Straw -- Puree -- Mechanical Soft -- Regular -- Multi-consistency -- Pill -- Cervical Esophageal Comment -- No flowsheet data found. [place] Siren 03/24/2018, 2:20 PM Orbie Pyo Colvin Caroli.Ed CCC-SLP Pager [phone]               (Echo, Carotid, EGD, Colonoscopy, ERCP)  Subjective:   Discharge Exam: Vitals:   03/28/18 0500 03/28/18 0858  BP: (!) 158/83   Pulse: 71   Resp: 14   Temp: 98.4 F (36.9 C)   SpO2: 97% 97%   Vitals:   03/27/18 1927 03/28/18 0032 03/28/18 0500 03/28/18 0858  BP: (!) 128/58 136/68 (!) 158/83   Pulse: 82  71   Resp: (!) 22  14   Temp: 98 F (36.7 C)  98.4 F (36.9 C)   TempSrc: Oral  Axillary   SpO2: 98%  97% 97%  Weight:   31.9 kg (70 lb 4.8 oz)   Height:        General: Pt is alert, awake, not in acute distress Cardiovascular: RRR, S1/S2 +, no rubs, no gallops Respiratory: CTA bilaterally, no wheezing, no  rhonchi Abdominal: Soft, NT, ND, bowel sounds + Extremities: no edema, no cyanosis    The results of significant diagnostics from this hospitalization (including imaging, microbiology, ancillary and laboratory) are listed below for reference.     Microbiology: No results found for this or any previous visit (from the past 240 hour(s)).   Labs: BNP (last 3 results) No results for input(s): BNP in the last 8760 hours. Basic Metabolic Panel: Recent Labs  Lab 03/22/18 1119 03/23/18 0417 03/24/18 0910  NA 143 141 145  K 4.7 4.2 3.5  CL 102 102 111  CO2 30 24 25   GLUCOSE 108* 151* 140*  BUN 34* 34* 40*  CREATININE 1.34* 1.22* 1.18*  CALCIUM 10.0 9.4 8.9   Liver Function Tests: Recent Labs  Lab 03/22/18 1119  AST 22  ALT 15  ALKPHOS 94  BILITOT 0.7  PROT 7.1  ALBUMIN 3.8   Recent Labs  Lab 03/22/18 1119  LIPASE 41   No results for input(s): AMMONIA in the last 168 hours. CBC: Recent Labs  Lab 03/22/18 1119 03/23/18 0417 03/24/18 0910  WBC 8.0 11.9* 8.6  NEUTROABS 5.6  --   --   HGB 13.6 12.8 11.4*  HCT 43.7 39.6 35.7*  MCV 95.0 94.1 94.2  PLT 217 283 223   Cardiac Enzymes: No results for input(s): CKTOTAL, CKMB, CKMBINDEX, TROPONINI in the last 168 hours. BNP: Invalid input(s): POCBNP CBG: Recent Labs  Lab 03/27/18 1656 03/27/18 2014 03/28/18 0016 03/28/18 0510 03/28/18 0754  GLUCAP 141* 125* 112* 118* 119*   D-Dimer No results for input(s): DDIMER in the last 72 hours. Hgb A1c No results for input(s): HGBA1C in the last 72 hours. Lipid Profile No results for input(s): CHOL, HDL, LDLCALC, TRIG, CHOLHDL, LDLDIRECT in the last 72 hours. Thyroid function studies No results for input(s): TSH, T4TOTAL, T3FREE, THYROIDAB in the last 72 hours.  Invalid input(s): FREET3 Anemia work up No results for input(s): VITAMINB12, FOLATE, FERRITIN, TIBC, IRON, RETICCTPCT in the last 72 hours. Urinalysis    Component Value Date/Time   COLORURINE YELLOW  02/10/2018 [place] 02/10/2018 1219   LABSPEC 1.013 02/10/2018 1219   PHURINE 7.0 02/10/2018 [place] 02/10/2018 1219   HGBUR NEGATIVE 02/10/2018 1219   BILIRUBINUR NEGATIVE 02/10/2018 1219   KETONESUR NEGATIVE 02/10/2018 1219   PROTEINUR 30 (A) 02/10/2018 1219   UROBILINOGEN 0.2 02/14/2012 0736   NITRITE NEGATIVE 02/10/2018 1219   LEUKOCYTESUR NEGATIVE 02/10/2018 1219   Sepsis Labs Invalid input(s): PROCALCITONIN,  WBC,  LACTICIDVEN Microbiology No results found for this or any previous visit (from the past 240 hour(s)).   Time coordinating discharge: 39 minutes  SIGNED:  Georgette Shell, MD  [facility] 03/28/2018, 10:28 AM  If 7PM-7AM, please contact night-coverage [email or web address] Password [email or web address]

## 2018-03-28 NOTE — Progress Notes (Signed)
Completed HCPOA, patient named two grandsons as health care agents.  Copy placed on chart and original on chart to be given to patient at discharge.     03/28/18 1155  Clinical Encounter Type  Visited With Patient;Other (Comment) (Volunteers as witnesses)  Visit Type Initial;Spiritual support  Regulatory affairs officer (For Healthcare)  Does Patient Have a Medical Advance Directive? Yes  Type of Paramedic of [place]

## 2018-03-28 NOTE — Progress Notes (Signed)
[facility] Liaison: RN visit   Notified by Jacqlyn Krauss, [facility] of patient/family request for [facility] services at home after discharge. Chart and patient information under reviewed and approved by [place], Dr. Lyman Speller.    Writer spoke with granddaughter, Ivin Booty and patient at bedside to initiate education related to hospice philosophy, services and team approach to care. Ivin Booty verbalized understanding of information given. Per discussion, plan is for discharge to home by PTAR today.   Please send signed and completed DNR form home with patient/family. Patient will need prescriptions for discharge comfort medications.   DME needs have been discussed, patient currently has the following equipment in the home: walker, shower chair and 3N1.  Patient/family requests the following DME for delivery to the home: hospital bed, OBT and transfer W/C. [facility] equipment manager has been notified and will contact [place] to arrange delivery to the home. Home address has been verified and is correct in the chart.Ivin Booty is the family member to contact to arrange time of delivery.   [facility] aware of the above. Please notify [facility] when patient is ready to leave the unit at discharge. (Call [phone] or [phone] after 5pm.) [facility] information and contact numbers given to [facility] at time of visit. Above information shared with             CMRN.   Please call with any hospice related questions.   Thank you for this referral.   Farrel Gordon, RN, [facility] Liaison [phone] ? Hospital liaisons are now on [place].

## 2018-03-28 NOTE — Plan of Care (Signed)
  Problem: Clinical Measurements: Goal: Will remain free from infection Outcome: Progressing Note:  No s/s of infection noted. Goal: Respiratory complications will improve Outcome: Progressing Note:  No s/s of respiratory distress noted.

## 2018-03-28 NOTE — Care Management Important Message (Signed)
Important Message  Patient Details  Name: Erin Cox MRN: [id number] Date of Birth: 18-Jan-1921   Medicare Important Message Given:  Yes    Malikhi Ogan P Atif Chapple 03/28/2018, 3:42 PM

## 2018-03-28 NOTE — Progress Notes (Signed)
Physical Therapy Treatment Patient Details Name: Erin Cox MRN: [id number] DOB: 05/02/1921 Today's Date: 03/28/2018    History of Present Illness Erin Cox is a delightful 84 y.o. female with medical history significant for atrial fibrillation, COPD not on home oxygen, prior stroke, hypertension,chronic kidney disease,severe protein calorie malnutrition,presents to the emergency department chief complaint generalized weakness and difficulty swallowing. Initial evaluation reveals uncontrolled blood pressure and includes evaluation of speech therapy who recommends nothing by mouth admission for further swallow evaluation.    PT Comments    Pt admitted with above diagnosis. Pt currently with functional limitations due to balance and endurance deficits. Pt was not feeling as well today.  Needed mod assist to use RW and walk around the bed to the chair.  Family supportive and still will be taking pt home.   Pt will benefit from skilled PT to increase their independence and safety with mobility to allow discharge to the venue listed below.     Follow Up Recommendations  Home health PT;Supervision/Assistance - 24 hour(Hospice care being arranged)     Equipment Recommendations  None recommended by PT    Recommendations for Other Services       Precautions / Restrictions Precautions Precautions: Fall Restrictions Weight Bearing Restrictions: No    Mobility  Bed Mobility Overal bed mobility: Needs Assistance Bed Mobility: Supine to Sit     Supine to sit: Min assist     General bed mobility comments: needed assist today for LEs and elevation of trunk.  Pt not seeming to feel as well today, less interactive  Transfers Overall transfer level: Needs assistance Equipment used: Rolling walker (2 wheeled) Transfers: Sit to/from Stand Sit to Stand: Min assist;Mod assist         General transfer comment: Assist to power up with posterior lean needing constant assist.    Ambulation/Gait Ambulation/Gait assistance: Min assist;Mod assist Ambulation Distance (Feet): 15 Feet Assistive device: Rolling walker (2 wheeled) Gait Pattern/deviations: Decreased stride length;Step-through pattern;Drifts right/left;Trunk flexed;Leaning posteriorly   Gait velocity interpretation: <1.31 ft/sec, indicative of household ambulator General Gait Details: Pt with trunk flexion which worsens as pt fatigues.  Posterior lean needing mod assist at times. Pt needs assist to steer RW as she steers it into objects on left in close spaces and walls.  Overall, needing more assist today.     Stairs             Wheelchair Mobility    Modified Rankin (Stroke Patients Only)       Balance Overall balance assessment: Needs assistance Sitting-balance support: No upper extremity supported;Feet supported Sitting balance-Leahy Scale: Fair     Standing balance support: Bilateral upper extremity supported;During functional activity Standing balance-Leahy Scale: Poor Standing balance comment: relies on UEs for balance                            Cognition Arousal/Alertness: Awake/alert Behavior During Therapy: Flat affect Overall Cognitive Status: Impaired/Different from baseline Area of Impairment: Following commands;Safety/judgement;Problem solving                       Following Commands: Follows one step commands inconsistently;Follows one step commands with increased time Safety/Judgement: Decreased awareness of safety;Decreased awareness of deficits   Problem Solving: Difficulty sequencing;Requires verbal cues;Requires tactile cues        Exercises      General Comments        Pertinent Vitals/Pain Pain  Assessment: 0-10 Faces Pain Scale: Hurts little more Pain Location: back Pain Descriptors / Indicators: Sore Pain Intervention(s): Limited activity within patient's tolerance;Monitored during session;Repositioned    Home Living                       Prior Function            PT Goals (current goals can now be found in the care plan section) Acute Rehab PT Goals Patient Stated Goal: to go home Progress towards PT goals: Progressing toward goals    Frequency    Min 3X/week      PT Plan Current plan remains appropriate    Co-evaluation              AM-PAC PT "6 Clicks" Daily Activity  Outcome Measure  Difficulty turning over in bed (including adjusting bedclothes, sheets and blankets)?: None Difficulty moving from lying on back to sitting on the side of the bed? : None Difficulty sitting down on and standing up from a chair with arms (e.g., wheelchair, bedside commode, etc,.)?: A Lot Help needed moving to and from a bed to chair (including a wheelchair)?: A Lot Help needed walking in hospital room?: A Lot Help needed climbing 3-5 steps with a railing? : Total 6 Click Score: 15    End of Session Equipment Utilized During Treatment: Gait belt Activity Tolerance: Patient limited by fatigue Patient left: with call bell/phone within reach;with family/visitor present;in chair;with chair alarm set Nurse Communication: Mobility status PT Visit Diagnosis: Unsteadiness on feet (R26.81);Muscle weakness (generalized) (M62.81);Pain Pain - part of body: (back)     Time: 1510-1530 PT Time Calculation (min) (ACUTE ONLY): 20 min  Charges:  $Gait Training: 8-22 mins                    G Codes:       [name],PT Acute Rehabilitation [phone] (pager)    Denice Paradise 03/28/2018, 4:08 PM

## 2018-03-28 NOTE — Progress Notes (Signed)
Patient and family received discharge information and acknowledged understanding of it. Family received printed prescription and advance directive form. PTAR was called. Will discharge with DNR form. Patient IV was removed.

## 2018-03-28 NOTE — Progress Notes (Signed)
  Speech Language Pathology Treatment: Dysphagia  Patient Details Name: Erin Cox MRN: [id number] DOB: 10/04/1921 Today's Date: 03/28/2018 Time: 5883-2549 SLP Time Calculation (min) (ACUTE ONLY): 15 min  Assessment / Plan / Recommendation Clinical Impression  Pt is discharging today to Hospice. Pt's granddaughter was present. Education was completed with granddaughter about diet and relaxing the diet if patient has specific request at home. If patient is coughing or choking while eating to discontinue that consistency so not to cause more discomfort. However for quality of life and pleasure, pt can be allowed to have what she desires. Patient was awake today to observe directly. Granddaughter was comfortable and in agreed with the recommendations.    HPI HPI: 84 year old female with history of prior stroke, asthma, COPD, hypertension. Presented to ED from home via transportation from neighbor for evaluation of choking sensation during breakfast.Pt with recent admission for 01/2018 with MRI (4/18) revealed a moderate-sized acute nonhemorrhagic pontine infarct (no swallow assessment done).  Per daughters' report, pt discharged to [facility] on 02/14/18 had aspiration event that led to aspiration PNA on 02/16/18 with diet downgraded at bedside to puree with nectar thick liquids. Per report, pt continues with coughing on purees and nectar with facility planned instrumental swallow study but pt discharged home before facility could schedule. She discharged home on 5/16 with 24 hour care givers and continued overt s/s of aspiration with puree and nectar thick liquids (coughing per daughters' report).       SLP Plan  Other (Comment)(Patient discharging home with Hospice)       Recommendations  Diet recommendations: Dysphagia 1 (puree);Honey-thick liquid Liquids provided via: Cup;No straw Medication Administration: Via alternative means Compensations: Slow rate;Small sips/bites                Plan: Other (Comment)(Patient discharging home with Hospice)       [place], MA, CCC-SLP 03/28/2018 2:39 PM

## 2018-03-28 NOTE — Progress Notes (Signed)
Palliative note:   Patient in bed- denies pain. Ready to go home, smiles when asked if still in agreement with plan. Daughter in law at bedside. She is awaiting delivery of equipment prior to patient's discharge. Has been in contact with Hospice and [facility] and will receive services from them.   Erin Cox, AGNP-C Palliative Medicine  Please call Palliative Medicine team phone with any questions [phone]. For individual providers please see AMION.  Total time: 15 mins

## 2018-03-29 DIAGNOSIS — E785 Hyperlipidemia, unspecified: Secondary | ICD-10-CM | POA: Diagnosis not present

## 2018-03-29 DIAGNOSIS — S22000S Wedge compression fracture of unspecified thoracic vertebra, sequela: Secondary | ICD-10-CM | POA: Diagnosis not present

## 2018-03-29 DIAGNOSIS — M810 Age-related osteoporosis without current pathological fracture: Secondary | ICD-10-CM | POA: Diagnosis not present

## 2018-03-29 DIAGNOSIS — I4891 Unspecified atrial fibrillation: Secondary | ICD-10-CM | POA: Diagnosis not present

## 2018-03-29 DIAGNOSIS — R1310 Dysphagia, unspecified: Secondary | ICD-10-CM | POA: Diagnosis not present

## 2018-03-29 DIAGNOSIS — I10 Essential (primary) hypertension: Secondary | ICD-10-CM | POA: Diagnosis not present

## 2018-03-29 DIAGNOSIS — E46 Unspecified protein-calorie malnutrition: Secondary | ICD-10-CM | POA: Diagnosis not present

## 2018-03-29 DIAGNOSIS — R4701 Aphasia: Secondary | ICD-10-CM | POA: Diagnosis not present

## 2018-03-29 DIAGNOSIS — J449 Chronic obstructive pulmonary disease, unspecified: Secondary | ICD-10-CM | POA: Diagnosis not present

## 2018-03-29 DIAGNOSIS — J4520 Mild intermittent asthma, uncomplicated: Secondary | ICD-10-CM | POA: Diagnosis not present

## 2018-03-29 DIAGNOSIS — I679 Cerebrovascular disease, unspecified: Secondary | ICD-10-CM | POA: Diagnosis not present

## 2018-03-30 DIAGNOSIS — E46 Unspecified protein-calorie malnutrition: Secondary | ICD-10-CM | POA: Diagnosis not present

## 2018-03-30 DIAGNOSIS — J449 Chronic obstructive pulmonary disease, unspecified: Secondary | ICD-10-CM | POA: Diagnosis not present

## 2018-03-30 DIAGNOSIS — J4520 Mild intermittent asthma, uncomplicated: Secondary | ICD-10-CM | POA: Diagnosis not present

## 2018-03-30 DIAGNOSIS — R4701 Aphasia: Secondary | ICD-10-CM | POA: Diagnosis not present

## 2018-03-30 DIAGNOSIS — I10 Essential (primary) hypertension: Secondary | ICD-10-CM | POA: Diagnosis not present

## 2018-03-30 DIAGNOSIS — R1310 Dysphagia, unspecified: Secondary | ICD-10-CM | POA: Diagnosis not present

## 2018-04-01 DIAGNOSIS — R1310 Dysphagia, unspecified: Secondary | ICD-10-CM | POA: Diagnosis not present

## 2018-04-01 DIAGNOSIS — J449 Chronic obstructive pulmonary disease, unspecified: Secondary | ICD-10-CM | POA: Diagnosis not present

## 2018-04-01 DIAGNOSIS — I10 Essential (primary) hypertension: Secondary | ICD-10-CM | POA: Diagnosis not present

## 2018-04-01 DIAGNOSIS — J4520 Mild intermittent asthma, uncomplicated: Secondary | ICD-10-CM | POA: Diagnosis not present

## 2018-04-01 DIAGNOSIS — E46 Unspecified protein-calorie malnutrition: Secondary | ICD-10-CM | POA: Diagnosis not present

## 2018-04-01 DIAGNOSIS — R4701 Aphasia: Secondary | ICD-10-CM | POA: Diagnosis not present

## 2018-04-04 DIAGNOSIS — R1310 Dysphagia, unspecified: Secondary | ICD-10-CM | POA: Diagnosis not present

## 2018-04-04 DIAGNOSIS — E46 Unspecified protein-calorie malnutrition: Secondary | ICD-10-CM | POA: Diagnosis not present

## 2018-04-04 DIAGNOSIS — J449 Chronic obstructive pulmonary disease, unspecified: Secondary | ICD-10-CM | POA: Diagnosis not present

## 2018-04-04 DIAGNOSIS — R4701 Aphasia: Secondary | ICD-10-CM | POA: Diagnosis not present

## 2018-04-04 DIAGNOSIS — I10 Essential (primary) hypertension: Secondary | ICD-10-CM | POA: Diagnosis not present

## 2018-04-04 DIAGNOSIS — J4520 Mild intermittent asthma, uncomplicated: Secondary | ICD-10-CM | POA: Diagnosis not present

## 2018-04-06 DIAGNOSIS — J449 Chronic obstructive pulmonary disease, unspecified: Secondary | ICD-10-CM | POA: Diagnosis not present

## 2018-04-06 DIAGNOSIS — E46 Unspecified protein-calorie malnutrition: Secondary | ICD-10-CM | POA: Diagnosis not present

## 2018-04-06 DIAGNOSIS — I10 Essential (primary) hypertension: Secondary | ICD-10-CM | POA: Diagnosis not present

## 2018-04-06 DIAGNOSIS — J4520 Mild intermittent asthma, uncomplicated: Secondary | ICD-10-CM | POA: Diagnosis not present

## 2018-04-06 DIAGNOSIS — R1310 Dysphagia, unspecified: Secondary | ICD-10-CM | POA: Diagnosis not present

## 2018-04-06 DIAGNOSIS — R4701 Aphasia: Secondary | ICD-10-CM | POA: Diagnosis not present

## 2018-04-07 DIAGNOSIS — J449 Chronic obstructive pulmonary disease, unspecified: Secondary | ICD-10-CM | POA: Diagnosis not present

## 2018-04-07 DIAGNOSIS — R4701 Aphasia: Secondary | ICD-10-CM | POA: Diagnosis not present

## 2018-04-07 DIAGNOSIS — E46 Unspecified protein-calorie malnutrition: Secondary | ICD-10-CM | POA: Diagnosis not present

## 2018-04-07 DIAGNOSIS — R1310 Dysphagia, unspecified: Secondary | ICD-10-CM | POA: Diagnosis not present

## 2018-04-07 DIAGNOSIS — J4520 Mild intermittent asthma, uncomplicated: Secondary | ICD-10-CM | POA: Diagnosis not present

## 2018-04-07 DIAGNOSIS — I10 Essential (primary) hypertension: Secondary | ICD-10-CM | POA: Diagnosis not present

## 2018-04-08 DIAGNOSIS — R4701 Aphasia: Secondary | ICD-10-CM | POA: Diagnosis not present

## 2018-04-08 DIAGNOSIS — J4520 Mild intermittent asthma, uncomplicated: Secondary | ICD-10-CM | POA: Diagnosis not present

## 2018-04-08 DIAGNOSIS — E46 Unspecified protein-calorie malnutrition: Secondary | ICD-10-CM | POA: Diagnosis not present

## 2018-04-08 DIAGNOSIS — J449 Chronic obstructive pulmonary disease, unspecified: Secondary | ICD-10-CM | POA: Diagnosis not present

## 2018-04-08 DIAGNOSIS — I10 Essential (primary) hypertension: Secondary | ICD-10-CM | POA: Diagnosis not present

## 2018-04-08 DIAGNOSIS — R1310 Dysphagia, unspecified: Secondary | ICD-10-CM | POA: Diagnosis not present

## 2018-04-09 DIAGNOSIS — I10 Essential (primary) hypertension: Secondary | ICD-10-CM | POA: Diagnosis not present

## 2018-04-09 DIAGNOSIS — R4701 Aphasia: Secondary | ICD-10-CM | POA: Diagnosis not present

## 2018-04-09 DIAGNOSIS — J4520 Mild intermittent asthma, uncomplicated: Secondary | ICD-10-CM | POA: Diagnosis not present

## 2018-04-09 DIAGNOSIS — R1310 Dysphagia, unspecified: Secondary | ICD-10-CM | POA: Diagnosis not present

## 2018-04-09 DIAGNOSIS — E46 Unspecified protein-calorie malnutrition: Secondary | ICD-10-CM | POA: Diagnosis not present

## 2018-04-09 DIAGNOSIS — J449 Chronic obstructive pulmonary disease, unspecified: Secondary | ICD-10-CM | POA: Diagnosis not present

## 2018-04-11 DIAGNOSIS — R1310 Dysphagia, unspecified: Secondary | ICD-10-CM | POA: Diagnosis not present

## 2018-04-11 DIAGNOSIS — E46 Unspecified protein-calorie malnutrition: Secondary | ICD-10-CM | POA: Diagnosis not present

## 2018-04-11 DIAGNOSIS — J4520 Mild intermittent asthma, uncomplicated: Secondary | ICD-10-CM | POA: Diagnosis not present

## 2018-04-11 DIAGNOSIS — I10 Essential (primary) hypertension: Secondary | ICD-10-CM | POA: Diagnosis not present

## 2018-04-11 DIAGNOSIS — R4701 Aphasia: Secondary | ICD-10-CM | POA: Diagnosis not present

## 2018-04-11 DIAGNOSIS — J449 Chronic obstructive pulmonary disease, unspecified: Secondary | ICD-10-CM | POA: Diagnosis not present

## 2018-04-12 DIAGNOSIS — E46 Unspecified protein-calorie malnutrition: Secondary | ICD-10-CM | POA: Diagnosis not present

## 2018-04-12 DIAGNOSIS — I10 Essential (primary) hypertension: Secondary | ICD-10-CM | POA: Diagnosis not present

## 2018-04-12 DIAGNOSIS — J449 Chronic obstructive pulmonary disease, unspecified: Secondary | ICD-10-CM | POA: Diagnosis not present

## 2018-04-12 DIAGNOSIS — R1310 Dysphagia, unspecified: Secondary | ICD-10-CM | POA: Diagnosis not present

## 2018-04-12 DIAGNOSIS — R4701 Aphasia: Secondary | ICD-10-CM | POA: Diagnosis not present

## 2018-04-12 DIAGNOSIS — J4520 Mild intermittent asthma, uncomplicated: Secondary | ICD-10-CM | POA: Diagnosis not present

## 2018-04-13 DIAGNOSIS — J449 Chronic obstructive pulmonary disease, unspecified: Secondary | ICD-10-CM | POA: Diagnosis not present

## 2018-04-13 DIAGNOSIS — I10 Essential (primary) hypertension: Secondary | ICD-10-CM | POA: Diagnosis not present

## 2018-04-13 DIAGNOSIS — R1310 Dysphagia, unspecified: Secondary | ICD-10-CM | POA: Diagnosis not present

## 2018-04-13 DIAGNOSIS — R4701 Aphasia: Secondary | ICD-10-CM | POA: Diagnosis not present

## 2018-04-13 DIAGNOSIS — J4520 Mild intermittent asthma, uncomplicated: Secondary | ICD-10-CM | POA: Diagnosis not present

## 2018-04-13 DIAGNOSIS — E46 Unspecified protein-calorie malnutrition: Secondary | ICD-10-CM | POA: Diagnosis not present

## 2018-04-14 DIAGNOSIS — J4520 Mild intermittent asthma, uncomplicated: Secondary | ICD-10-CM | POA: Diagnosis not present

## 2018-04-14 DIAGNOSIS — I10 Essential (primary) hypertension: Secondary | ICD-10-CM | POA: Diagnosis not present

## 2018-04-14 DIAGNOSIS — R1310 Dysphagia, unspecified: Secondary | ICD-10-CM | POA: Diagnosis not present

## 2018-04-14 DIAGNOSIS — J449 Chronic obstructive pulmonary disease, unspecified: Secondary | ICD-10-CM | POA: Diagnosis not present

## 2018-04-14 DIAGNOSIS — R4701 Aphasia: Secondary | ICD-10-CM | POA: Diagnosis not present

## 2018-04-14 DIAGNOSIS — E46 Unspecified protein-calorie malnutrition: Secondary | ICD-10-CM | POA: Diagnosis not present

## 2018-04-25 DEATH — deceased

## 2018-06-04 IMAGING — CT CT HEAD W/O CM
4 series · 16 of 47 positions shown, 18 images · non-contrast
Comparison: CT and MRI 02/10/2018

CLINICAL DATA: Generalized weakness

EXAM:
CT HEAD WITHOUT CONTRAST
TECHNIQUE: Contiguous axial images were obtained from the base of the skull
through the vertex without intravenous contrast.

[Series 3: head wo · axial · 0.39mm/px · z∈[-102,-2]mm · 7 of 28 slices shown, 9 images]
[im 4/28  brain]
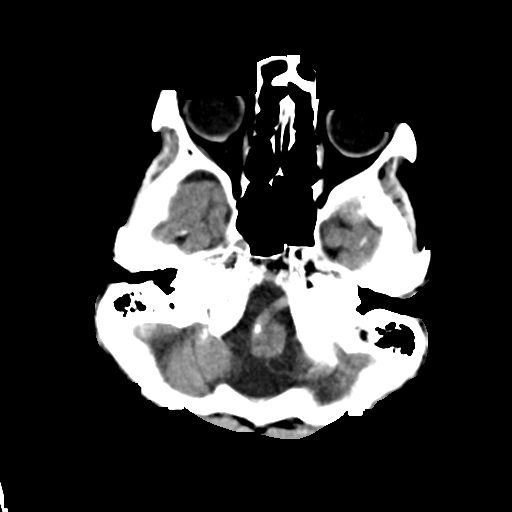
[im 4/28  bone]
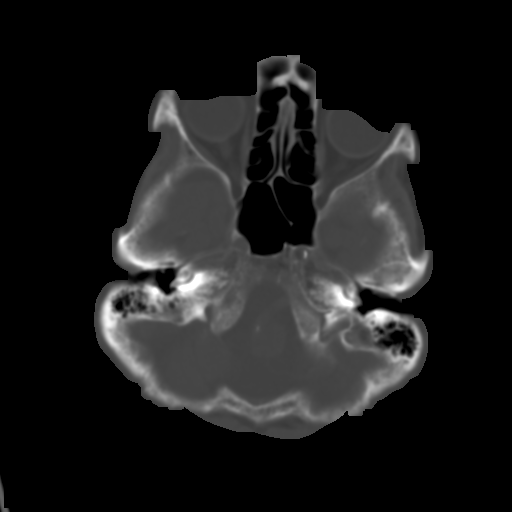
[im 7/28  brain]
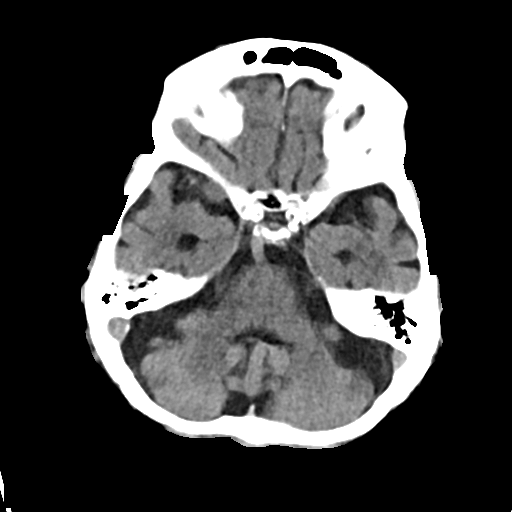
[im 11/28  brain]
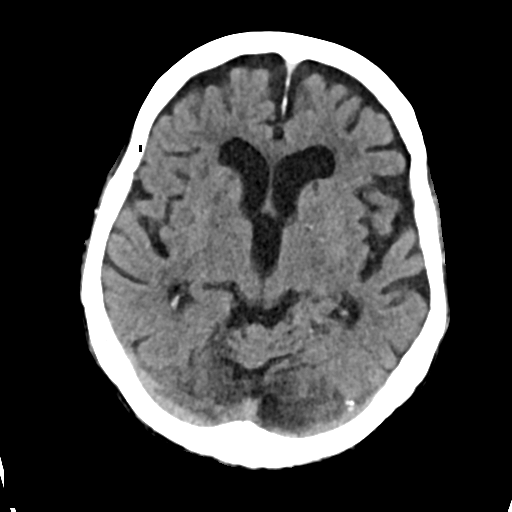
[im 14/28  brain]
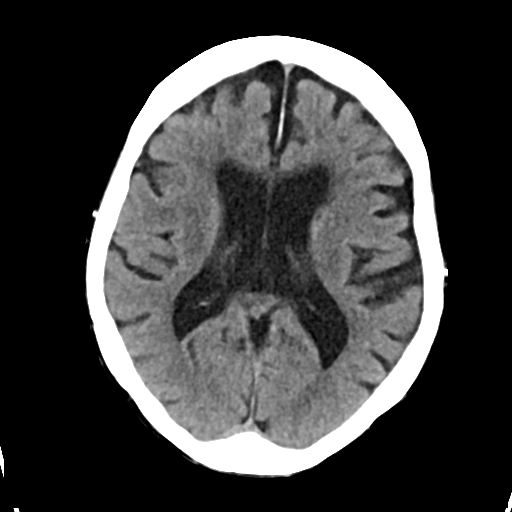
[im 17/28  brain]
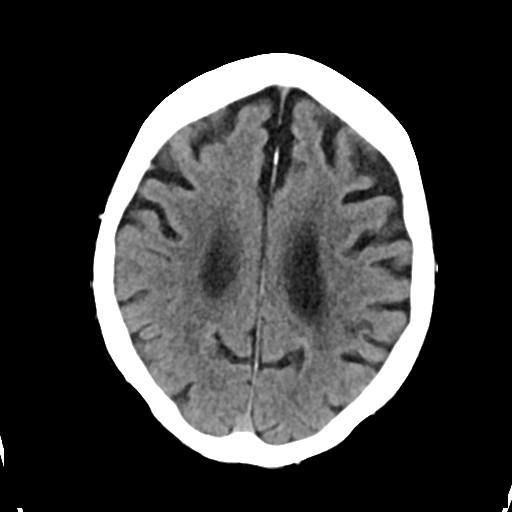
[im 17/28  bone]
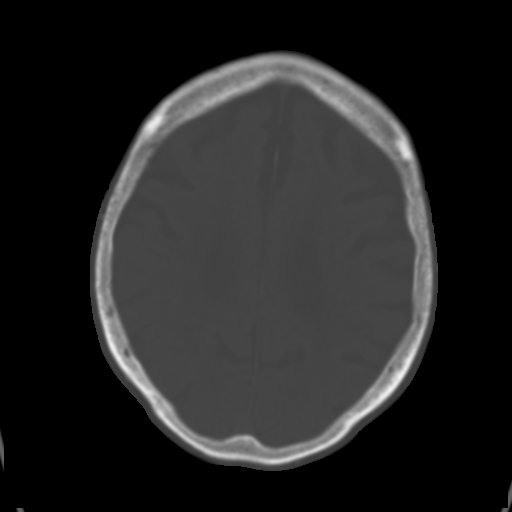
[im 21/28  brain]
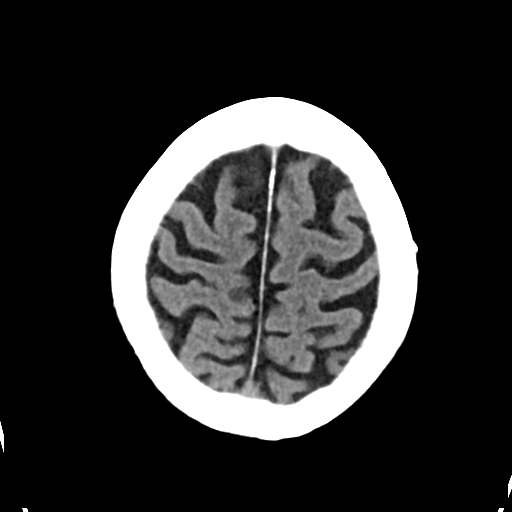
[im 24/28  brain]
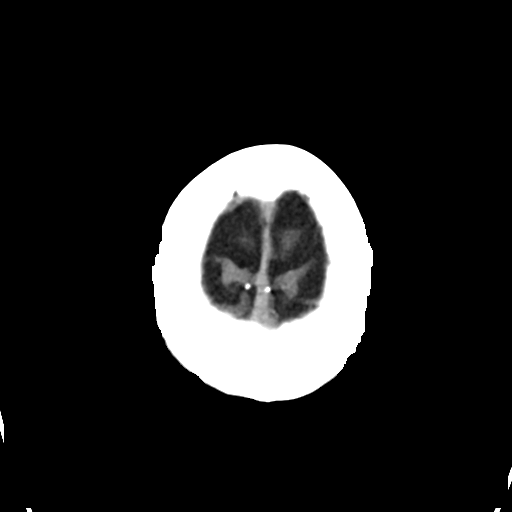

[Series 4: head bone · axial · 0.39mm/px · z∈[-104,-76]mm · 3 of 70 slices shown]
[im 7/70  bone]
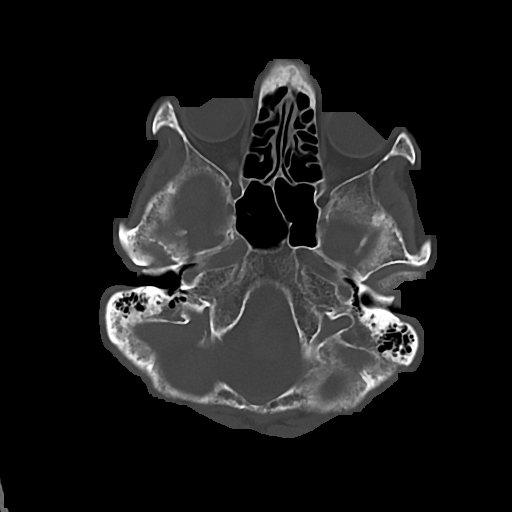
[im 14/70  bone]
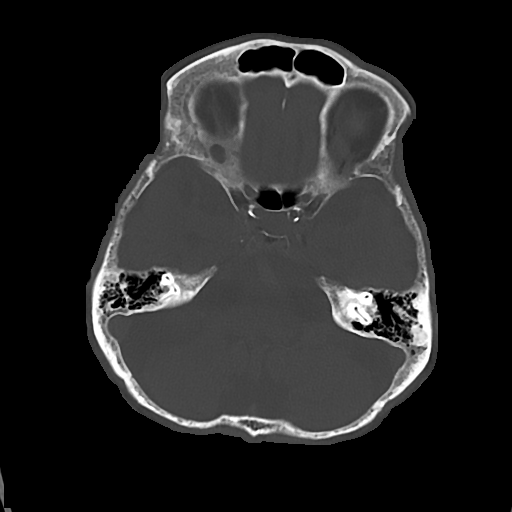
[im 21/70  bone]
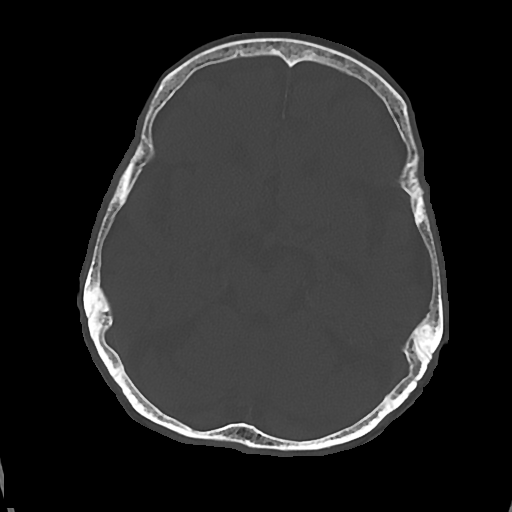

[Series 5: cor soft · coronal · 0.27mm/px · 3 of 67 slices shown]
[im 23/67  brain]
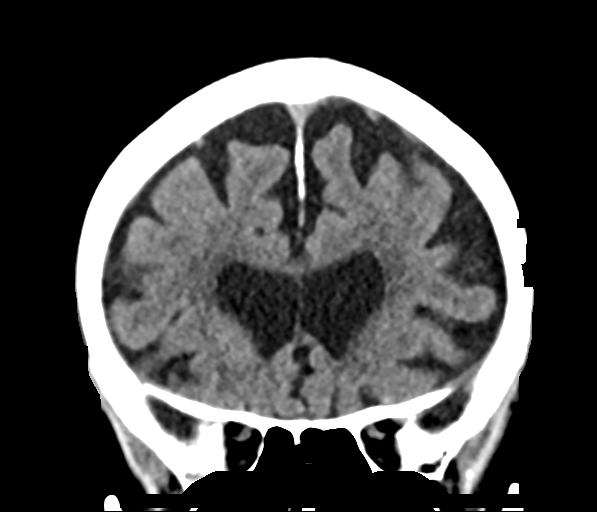
[im 30/67  brain]
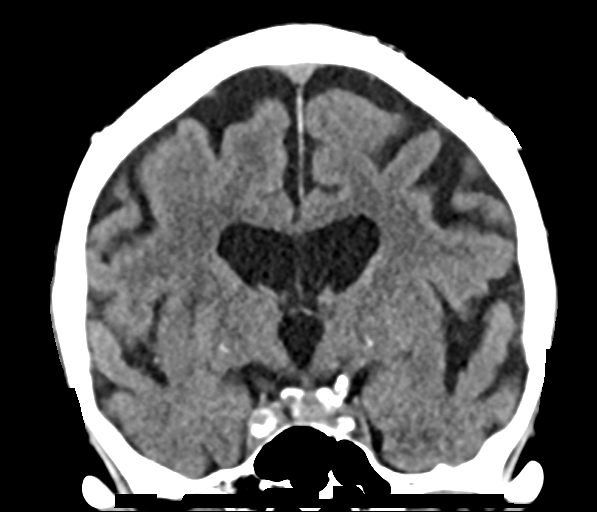
[im 37/67  brain]
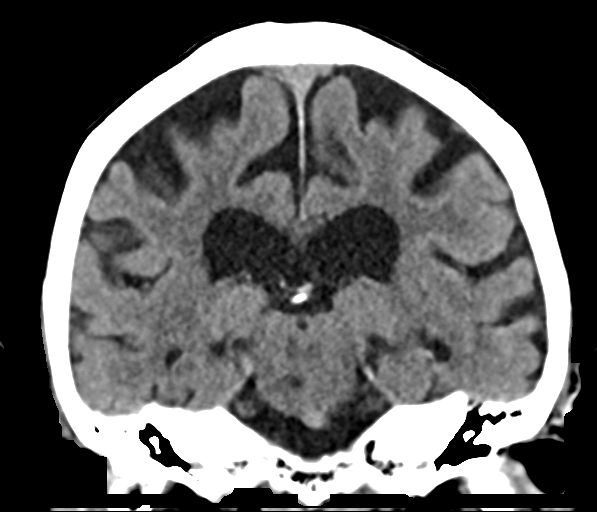

[Series 6: sag soft · sagittal · 0.27mm/px · 3 of 47 slices shown]
[im 16/47  brain]
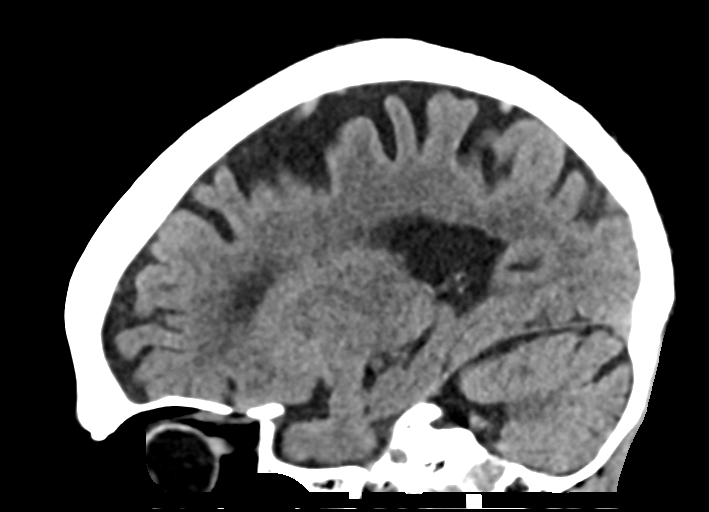
[im 24/47  brain]
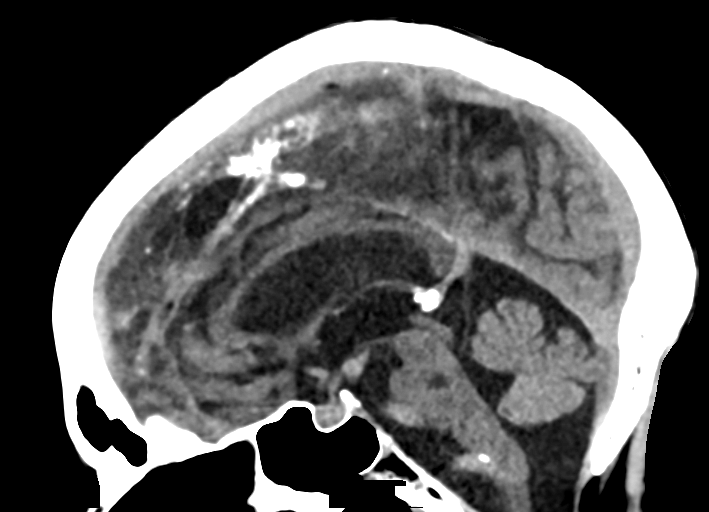
[im 31/47  brain]
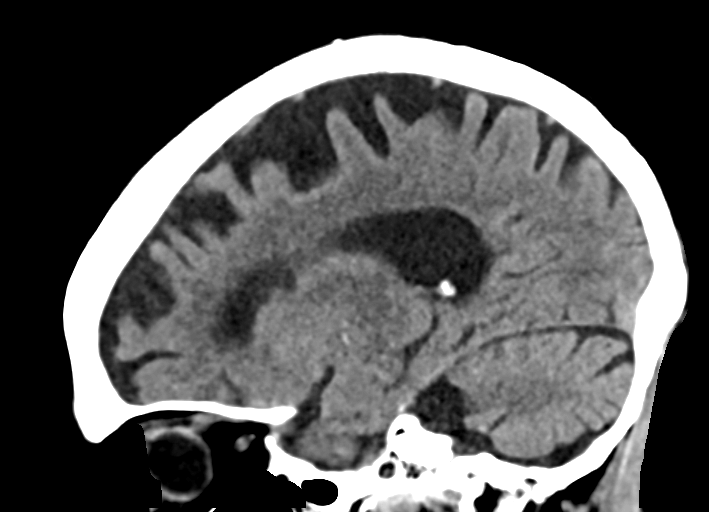

[16 of 47 positions shown; findings below may reference images not displayed]

FINDINGS: Brain: Moderate atrophy. Negative for hydrocephalus. Chronic infarct
right pons. Mild chronic ischemia in the white matter.

Negative for acute infarct, hemorrhage, or mass

Vascular: Atherosclerotic calcification. Negative for hyperdense
vessel

Skull: Negative

Sinuses/Orbits: Negative

Other: None
IMPRESSION: Atrophy and chronic ischemia. Chronic infarct right pons. No acute
abnormality.

## 2018-06-04 IMAGING — DX DG ELBOW COMPLETE 3+V*L*
4 series · 4 of 4 positions shown · non-contrast
Comparison: None.

CLINICAL DATA: Fell with bruising of the left elbow

EXAM:
LEFT ELBOW - COMPLETE 3+ VIEW

[x elbow ap left]
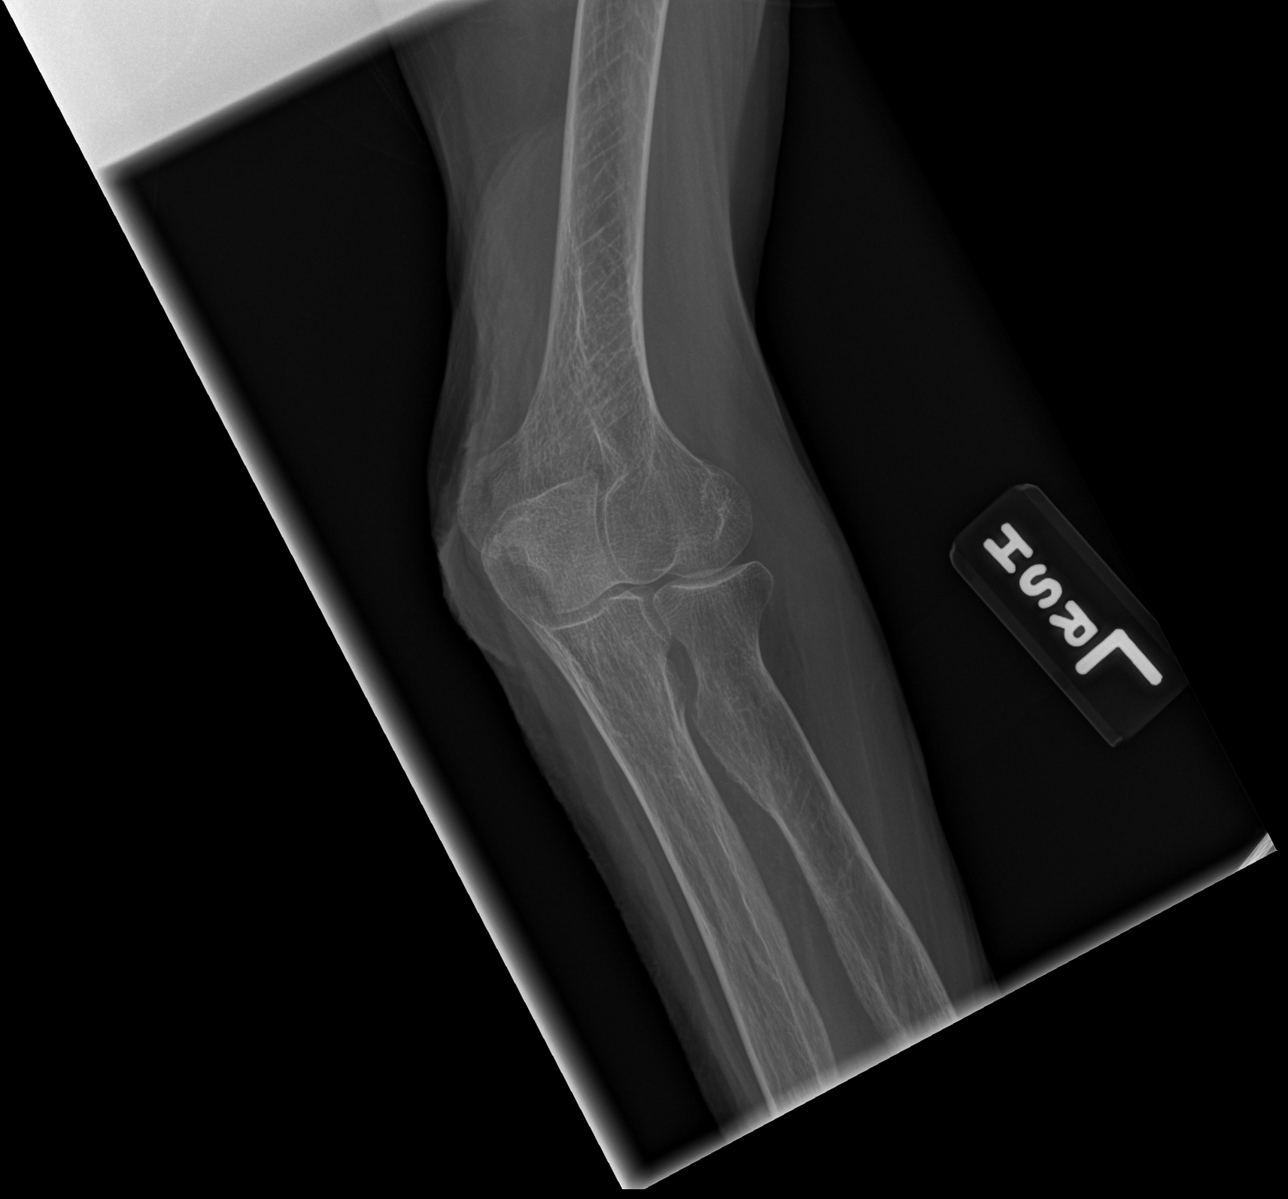

[x elbow obl left (1 of 2)]
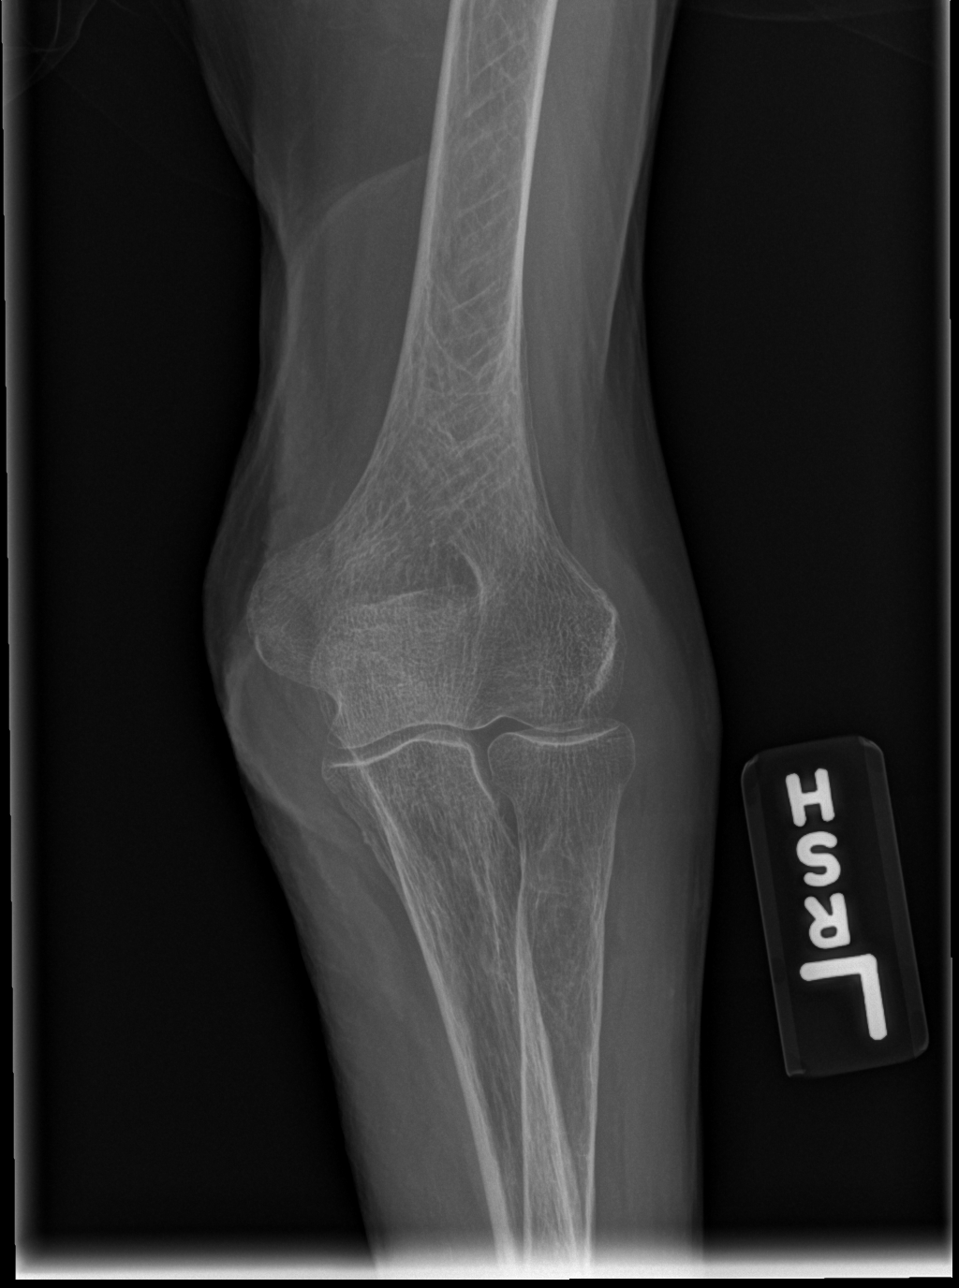

[x elbow obl left (2 of 2)]
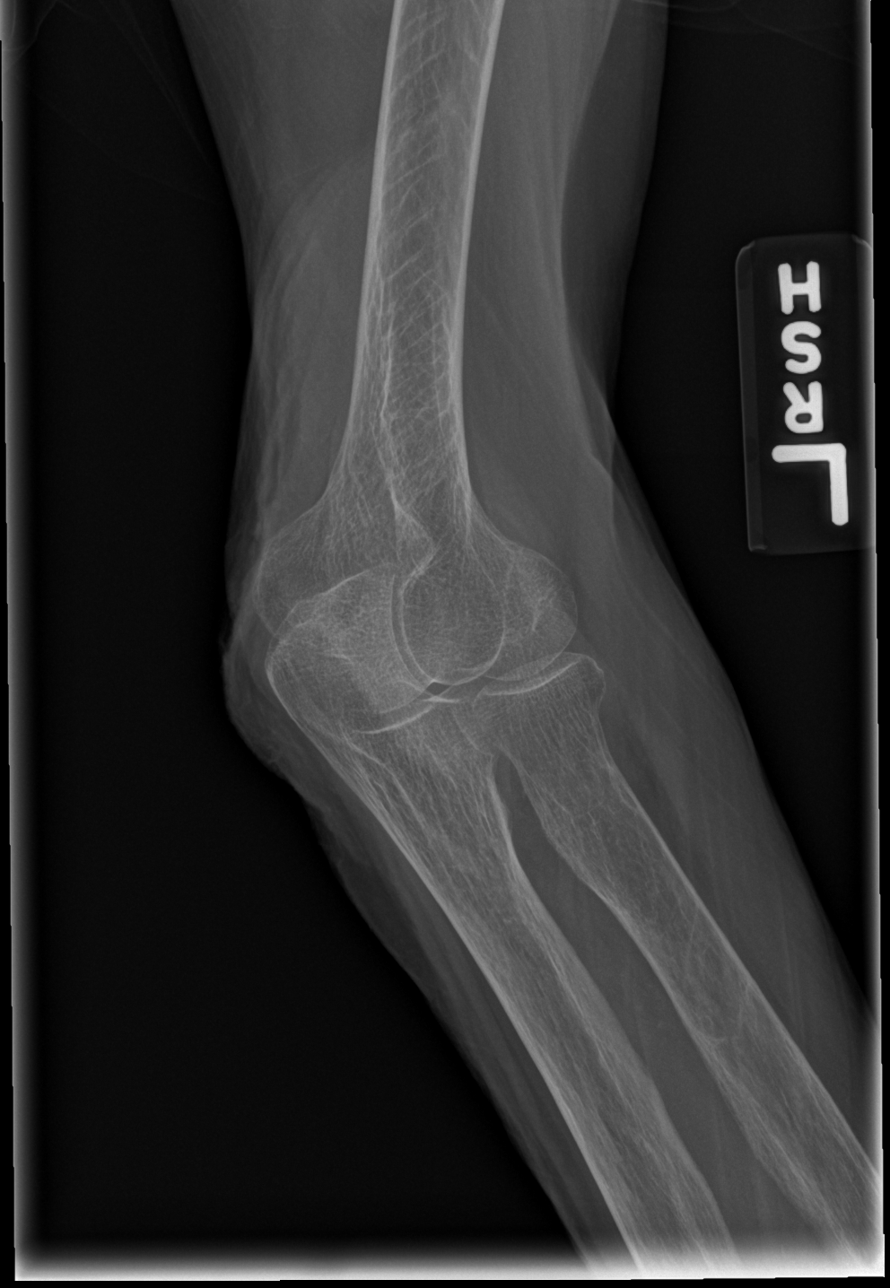

[x elbow lat left]
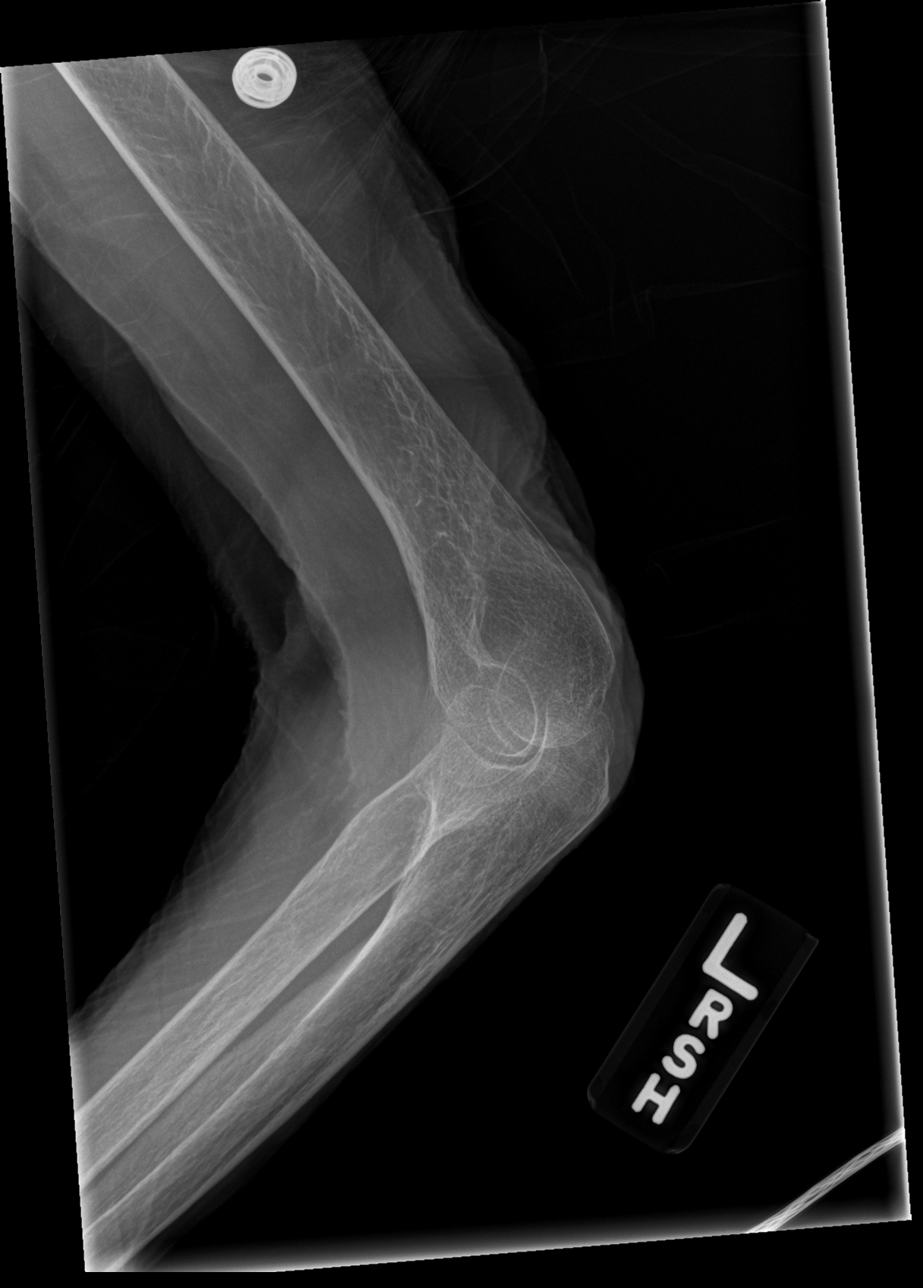

[4 of 4 positions shown; findings below may reference images not displayed]

FINDINGS: A true lateral view of the left elbow was not obtained but no
definite joint effusion is seen on the images obtained. The bones
appear somewhat osteopenic. Alignment is normal. Joint spaces appear
normal. No fracture is noted.
IMPRESSION: No definite fracture or effusion.  Osteopenia.

## 2018-06-07 IMAGING — DX DG CHEST 1V PORT
1 series · 1 of 1 positions shown · non-contrast
Comparison: 03/22/2018

CLINICAL DATA: Shortness of breath

EXAM:
PORTABLE CHEST 1 VIEW

[chest ap]
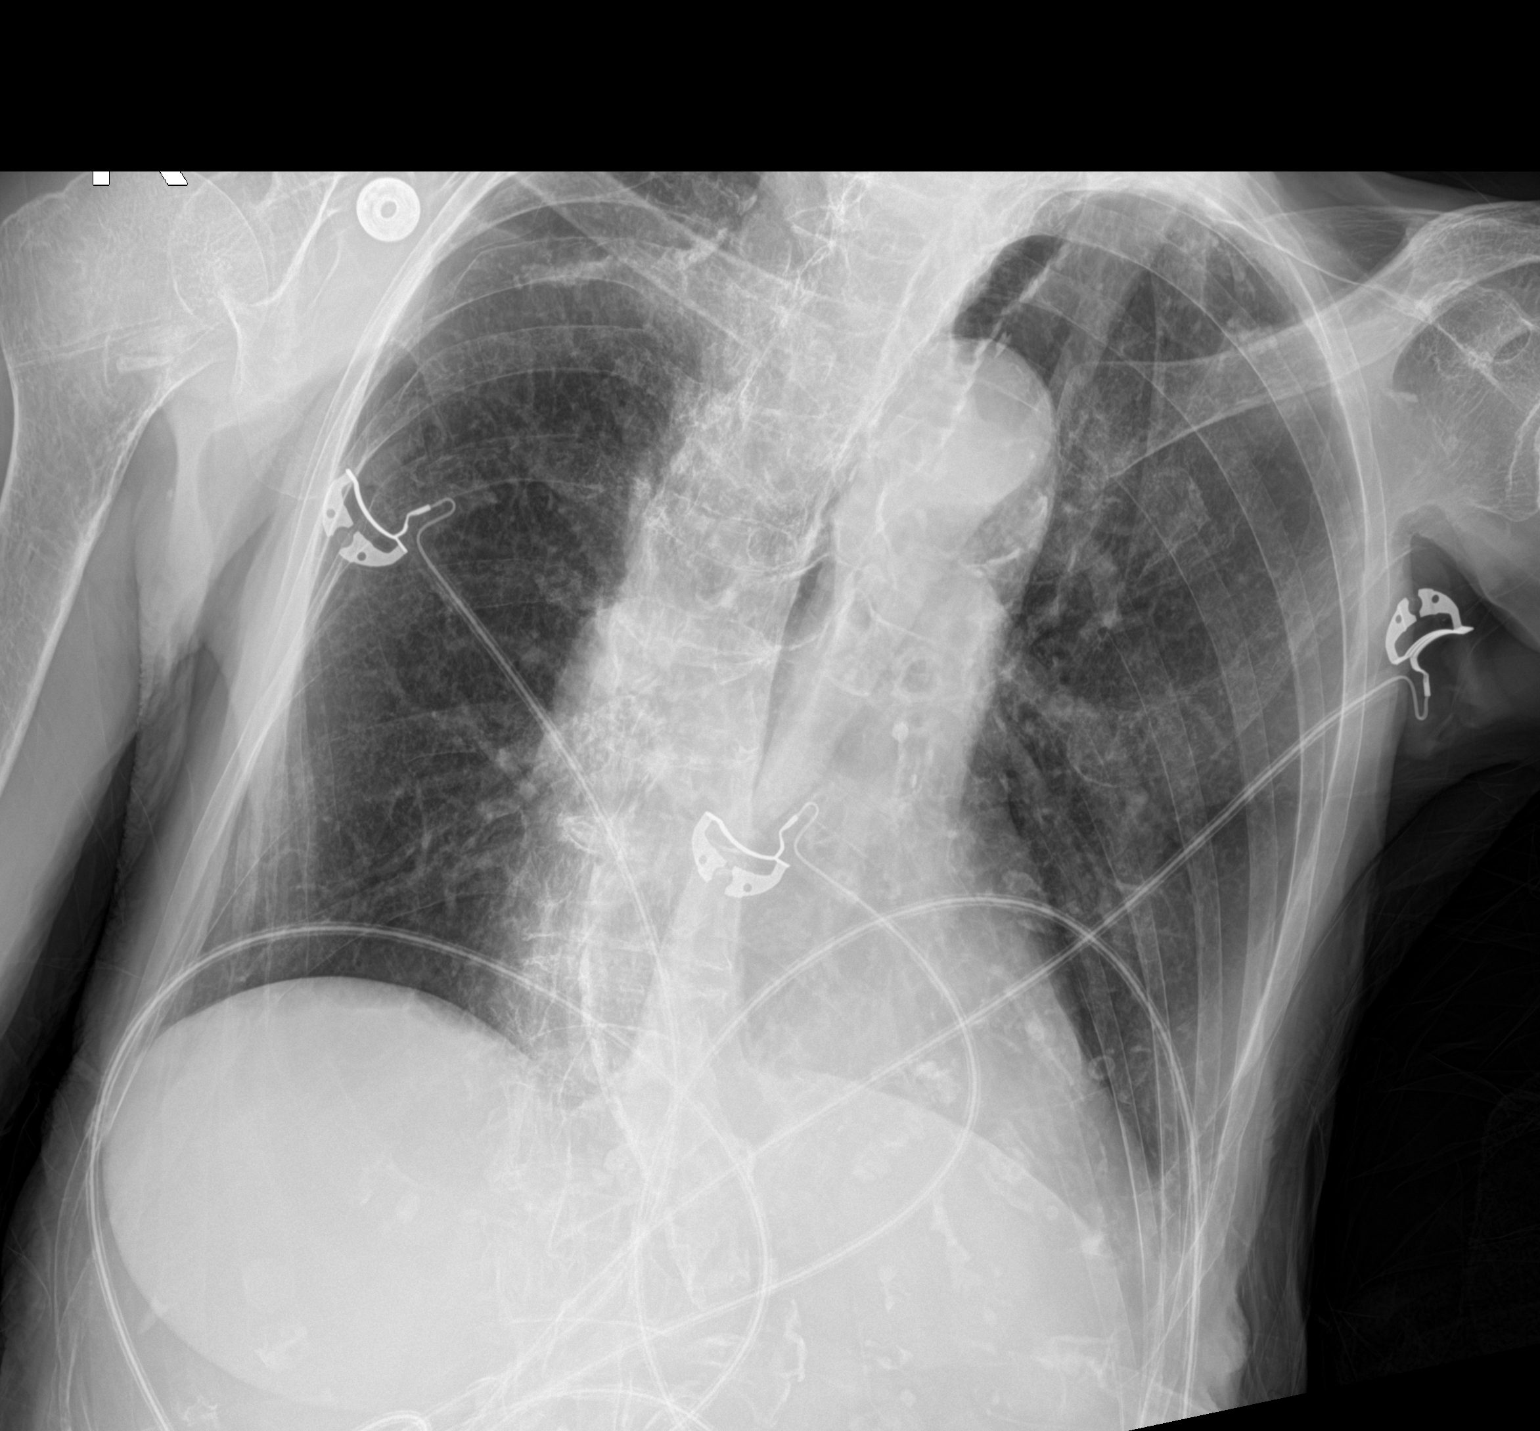

[1 of 1 positions shown; findings below may reference images not displayed]

FINDINGS: Cardiac shadow is stable. Aortic calcifications are again seen.
Lungs are well aerated without focal infiltrate or sizable effusion.
No acute bony abnormality is seen.
IMPRESSION: No acute abnormality noted.

## 2018-06-15 ENCOUNTER — Telehealth: Payer: Self-pay | Admitting: Adult Health

## 2018-06-15 NOTE — Telephone Encounter (Signed)
Noted. Thank you for notifying me.

## 2018-06-15 NOTE — Telephone Encounter (Signed)
Tammi Klippel (granddaughter) c/a appt on 8/26, she said they forgot to call. She said the pt passed on 05-06-2018.  FYI

## 2018-06-20 ENCOUNTER — Ambulatory Visit: Payer: Medicare Other | Admitting: Adult Health
